# Patient Record
Sex: Male | Born: 1945 | Race: White | Hispanic: No | Marital: Married | Smoking: Former smoker
Health system: Southern US, Community
[De-identification: ages and names within clinical notes are randomized; demographics above are authoritative.]

## PROBLEM LIST (undated history)

## (undated) DIAGNOSIS — R011 Cardiac murmur, unspecified: Secondary | ICD-10-CM

## (undated) DIAGNOSIS — I10 Essential (primary) hypertension: Secondary | ICD-10-CM

## (undated) DIAGNOSIS — E119 Type 2 diabetes mellitus without complications: Secondary | ICD-10-CM

## (undated) DIAGNOSIS — E785 Hyperlipidemia, unspecified: Secondary | ICD-10-CM

## (undated) HISTORY — PX: COLONOSCOPY: SHX174

## (undated) HISTORY — DX: Type 2 diabetes mellitus without complications: E11.9

## (undated) HISTORY — DX: Hyperlipidemia, unspecified: E78.5

---

## 2008-05-28 ENCOUNTER — Ambulatory Visit (HOSPITAL_COMMUNITY): Admission: RE | Admit: 2008-05-28 | Discharge: 2008-05-28 | Payer: Self-pay | Admitting: Orthopedic Surgery

## 2010-09-23 ENCOUNTER — Other Ambulatory Visit (HOSPITAL_COMMUNITY): Payer: Self-pay | Admitting: Family Medicine

## 2010-09-23 ENCOUNTER — Ambulatory Visit (HOSPITAL_COMMUNITY)
Admission: RE | Admit: 2010-09-23 | Discharge: 2010-09-23 | Disposition: A | Discharge status: Home / Self Care | Payer: Medicare Other | Source: Ambulatory Visit | Attending: Family Medicine | Admitting: Family Medicine

## 2010-09-23 DIAGNOSIS — Z139 Encounter for screening, unspecified: Secondary | ICD-10-CM

## 2010-09-23 DIAGNOSIS — I700 Atherosclerosis of aorta: Secondary | ICD-10-CM | POA: Insufficient documentation

## 2010-09-23 DIAGNOSIS — Z1389 Encounter for screening for other disorder: Secondary | ICD-10-CM | POA: Insufficient documentation

## 2012-10-18 ENCOUNTER — Encounter: Payer: Self-pay | Admitting: *Deleted

## 2012-10-19 ENCOUNTER — Ambulatory Visit (INDEPENDENT_AMBULATORY_CARE_PROVIDER_SITE_OTHER): Payer: Medicare Other | Admitting: Family Medicine

## 2012-10-19 ENCOUNTER — Encounter: Payer: Self-pay | Admitting: Family Medicine

## 2012-10-19 VITALS — BP 122/77 | Temp 97.3°F | Wt 206.8 lb

## 2012-10-19 DIAGNOSIS — R300 Dysuria: Secondary | ICD-10-CM

## 2012-10-19 LAB — POCT URINALYSIS DIPSTICK: pH, UA: 5

## 2012-10-19 NOTE — Progress Notes (Signed)
  Subjective:    Patient ID: Jose Shah, male    DOB: 29-Sep-1945, 67 y.o.   MRN: [id number]  Urinary Tract Infection  This is a new problem. The current episode started 1 to 4 weeks ago. The problem occurs intermittently. The problem has been unchanged. The quality of the pain is described as burning. There has been no fever. Associated symptoms include flank pain. Pertinent negatives include no hematuria. He has tried nothing for the symptoms.   He denies abdominal pain denies vomiting diarrhea. Denies fever or chills.   Review of Systems  Genitourinary: Positive for flank pain. Negative for hematuria.       Objective:   Physical Exam Lungs are clear heart is regular pulse normal abdomen soft no guarding or rebound prostate exam is slightly enlarged but not tender. There is some softness to the prostate but he denies pain with it. Penile exam shows urine femur around the head of the penis and around the glans of the penis a probably associated with tinea       Assessment & Plan:  #1 dysuria related to your edema around the foreskin-Nizoral cream as directed #2 I doubt prostatitis his prostate slightly enlarged but not really tender but I told him if his symptoms don't get better start Cipro 500 mg twice a day for 2 weeks he will start this Friday if ongoing trouble.

## 2012-10-29 IMAGING — US US AORTA SCREENING (MEDICARE)
1 series · 14 of 17 positions shown · non-contrast
Comparison: None.

CLINICAL DATA: 64-year-old for initial Medicare screening.

ABDOMINAL AORTA SCREENING ULTRASOUND 09/23/2010:
TECHNIQUE: Ultrasound examination of the abdominal aorta was
performed as a screening evaluation for abdominal aortic aneurysm.

[Series 1: us aorta screening (medicare) · 0.28mm/px · 14 of 17 slices shown]
[im 1/17]
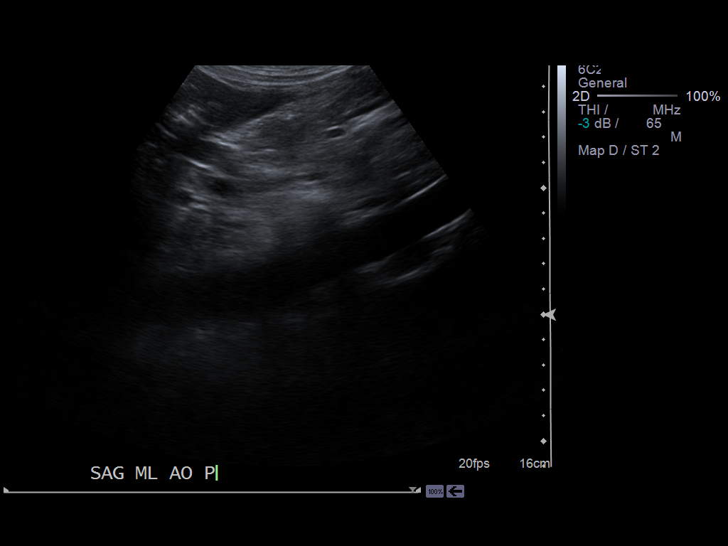
[im 2/17]
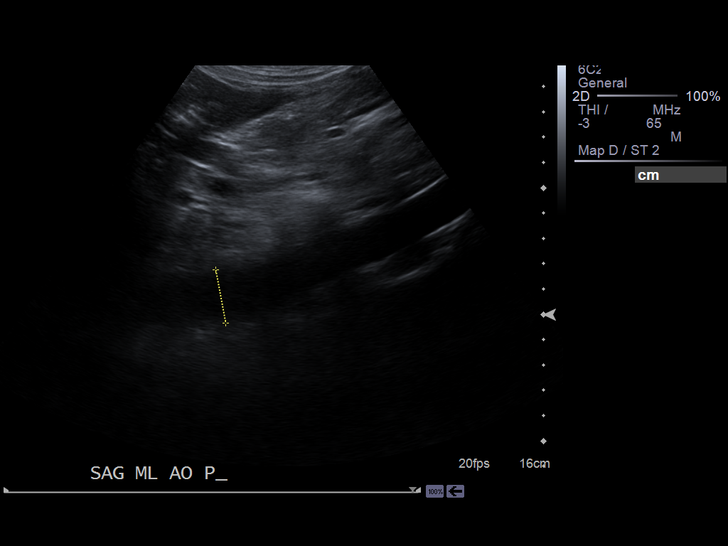
[im 4/17]
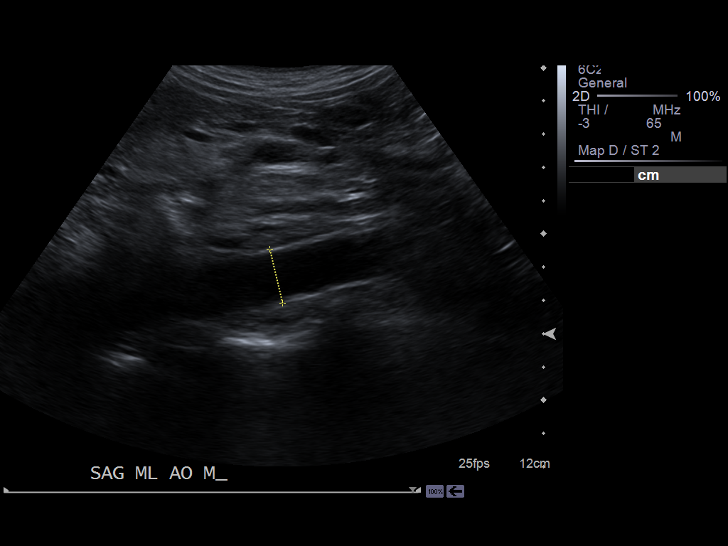
[im 5/17]
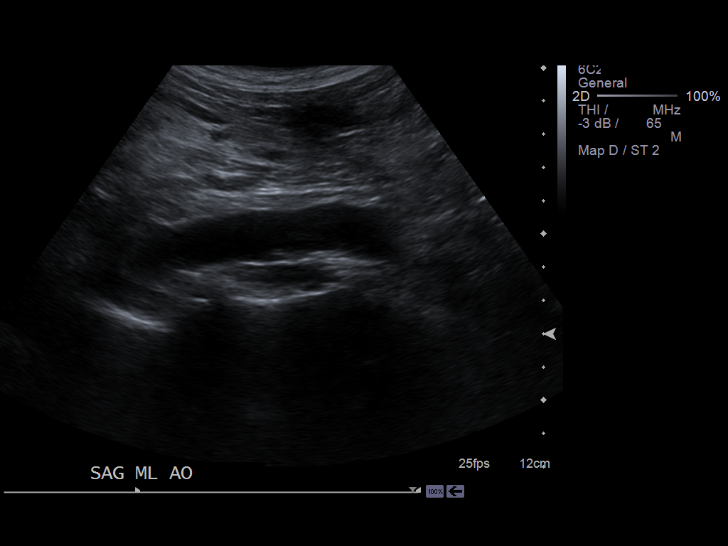
[im 6/17]
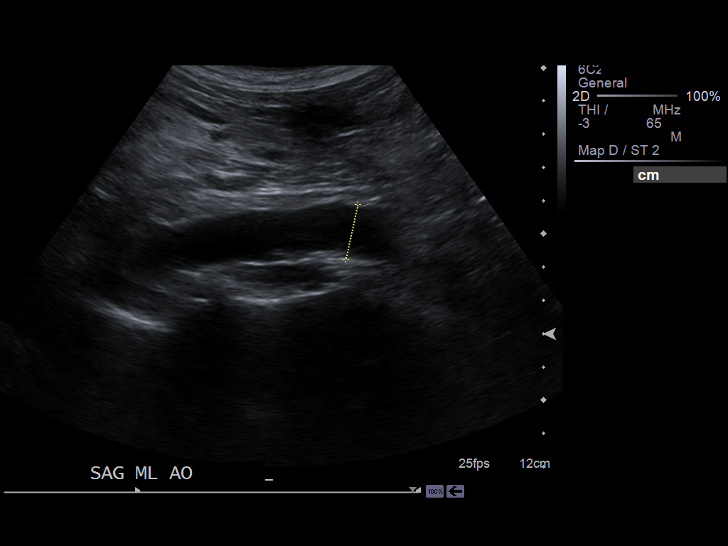
[im 7/17]
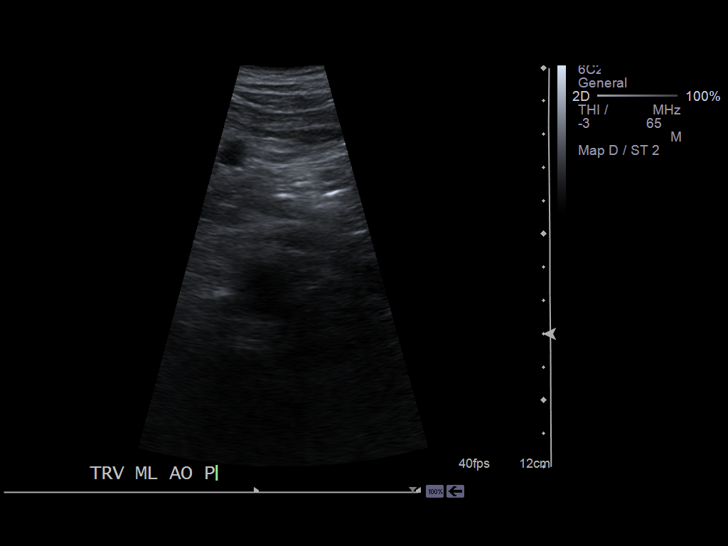
[im 8/17]
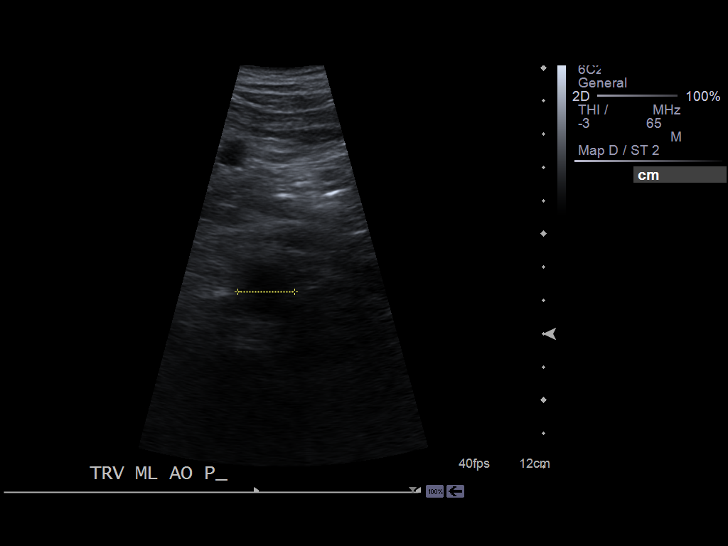
[im 10/17]
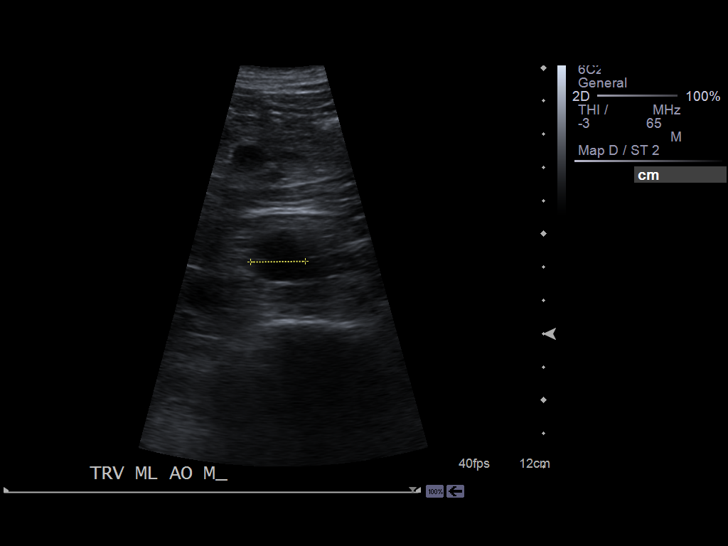
[im 11/17]
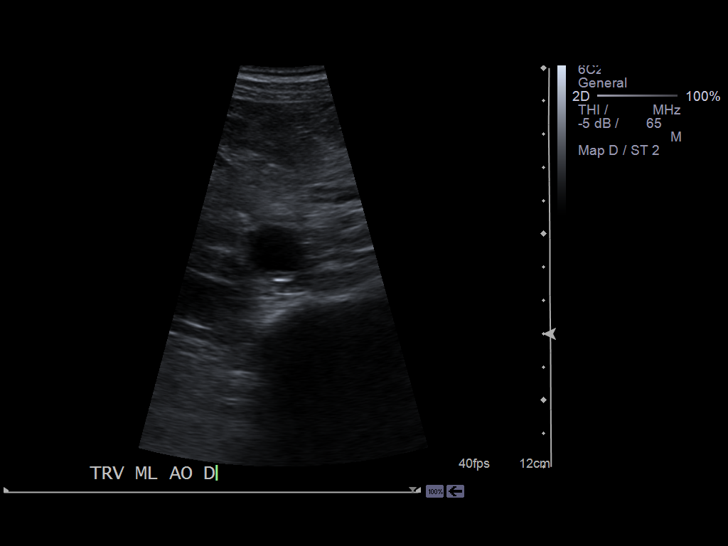
[im 12/17]
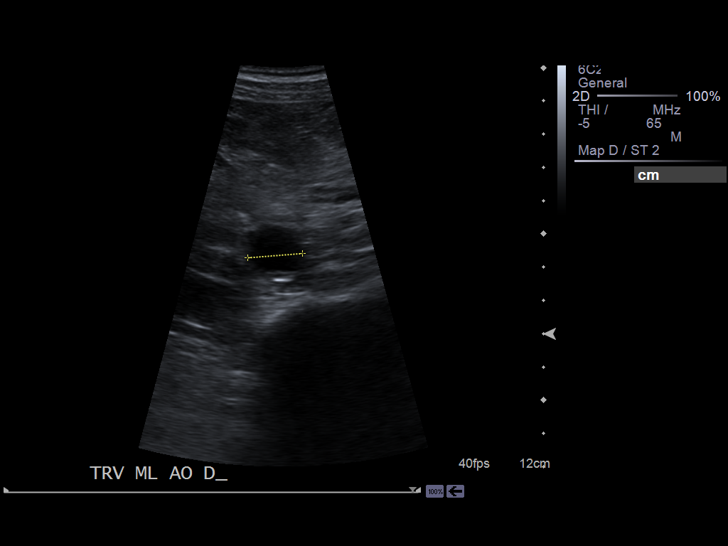
[im 13/17]
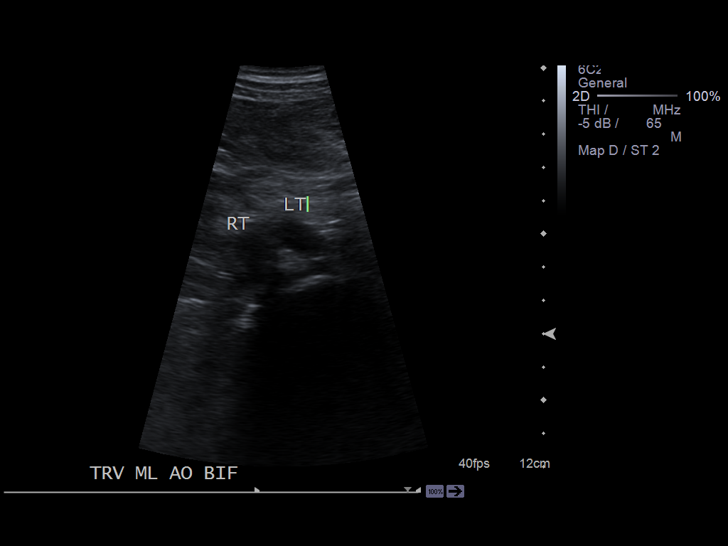
[im 14/17]
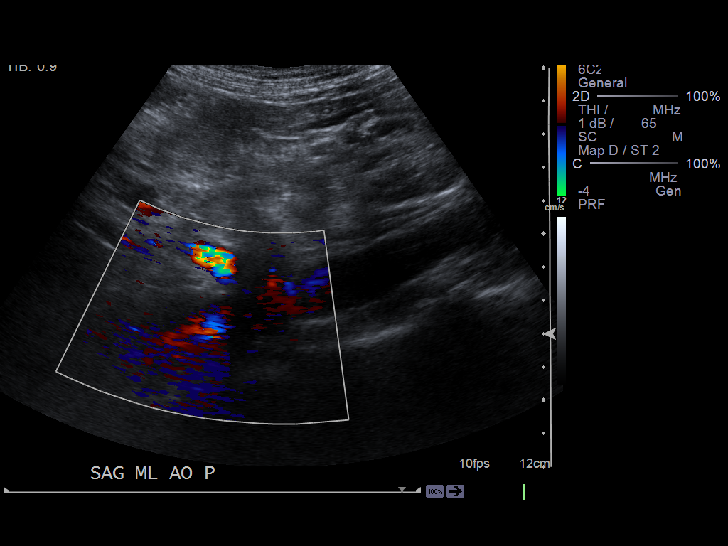
[im 16/17]
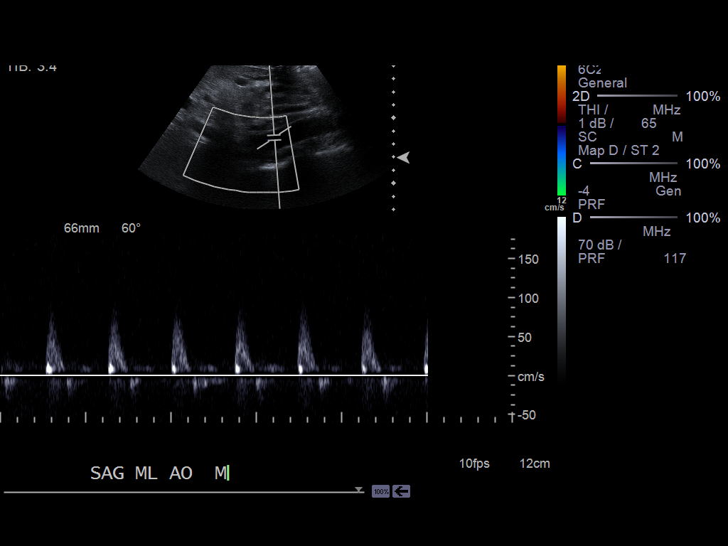
[im 17/17]
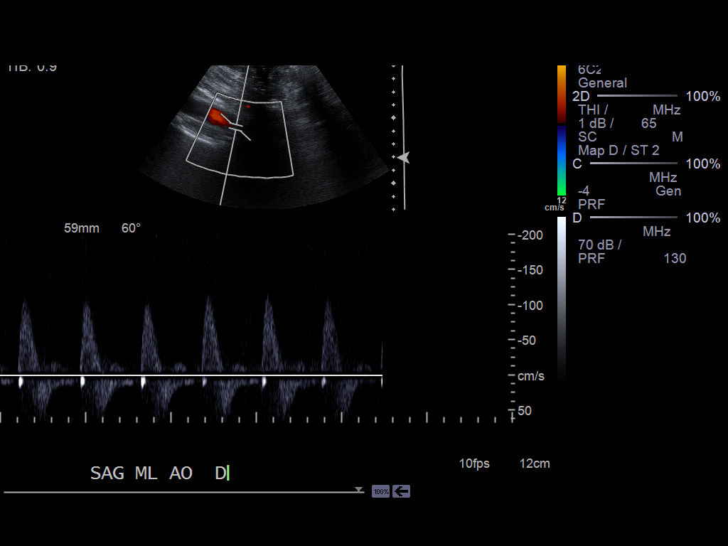

[14 of 17 positions shown; findings below may reference images not displayed]

Abdominal Aorta: No aneurysm identified.  Mild atherosclerosis.

      Maximum AP diameter:  2.1 cm.
      Maximum TRV diameter:  1.7 cm.
IMPRESSION: No evidence of abdominal aortic aneurysm.

## 2012-11-01 ENCOUNTER — Encounter: Payer: Self-pay | Admitting: Family Medicine

## 2012-11-01 ENCOUNTER — Ambulatory Visit (INDEPENDENT_AMBULATORY_CARE_PROVIDER_SITE_OTHER): Payer: Medicare Other | Admitting: Family Medicine

## 2012-11-01 ENCOUNTER — Other Ambulatory Visit: Payer: Self-pay | Admitting: *Deleted

## 2012-11-01 VITALS — BP 128/84 | HR 70 | Wt 209.0 lb

## 2012-11-01 DIAGNOSIS — Z125 Encounter for screening for malignant neoplasm of prostate: Secondary | ICD-10-CM

## 2012-11-01 DIAGNOSIS — E119 Type 2 diabetes mellitus without complications: Secondary | ICD-10-CM | POA: Insufficient documentation

## 2012-11-01 DIAGNOSIS — E785 Hyperlipidemia, unspecified: Secondary | ICD-10-CM | POA: Insufficient documentation

## 2012-11-01 DIAGNOSIS — Z79899 Other long term (current) drug therapy: Secondary | ICD-10-CM

## 2012-11-01 DIAGNOSIS — I10 Essential (primary) hypertension: Secondary | ICD-10-CM

## 2012-11-01 LAB — BASIC METABOLIC PANEL
BUN: 15 mg/dL (ref 6–23)
Calcium: 9.4 mg/dL (ref 8.4–10.5)
Creat: 1.21 mg/dL (ref 0.50–1.35)

## 2012-11-01 LAB — POCT GLYCOSYLATED HEMOGLOBIN (HGB A1C): Hemoglobin A1C: 6

## 2012-11-01 LAB — LIPID PANEL
HDL: 38 mg/dL — ABNORMAL LOW (ref >39)
LDL Cholesterol: 69 mg/dL (ref 0–99)
Triglycerides: 86 mg/dL (ref <150)

## 2012-11-01 LAB — HEPATIC FUNCTION PANEL
ALT: 17 U/L (ref 0–53)
AST: 14 U/L (ref 0–37)
Alkaline Phosphatase: 55 U/L (ref 39–117)
Indirect Bilirubin: 0.4 mg/dL (ref 0.0–0.9)
Total Protein: 6.3 g/dL (ref 6.0–8.3)

## 2012-11-01 MED ORDER — GLYBURIDE 5 MG PO TABS: 5.0000 mg | ORAL_TABLET | Freq: Two times a day (BID) | ORAL | Status: DC

## 2012-11-01 MED ORDER — ENALAPRIL MALEATE 10 MG PO TABS: 10.0000 mg | ORAL_TABLET | Freq: Every day | ORAL | Status: DC

## 2012-11-01 MED ORDER — METFORMIN HCL 1000 MG PO TABS: 1000.0000 mg | ORAL_TABLET | Freq: Two times a day (BID) | ORAL | Status: DC

## 2012-11-01 MED ORDER — SIMVASTATIN 20 MG PO TABS: 20.0000 mg | ORAL_TABLET | Freq: Every evening | ORAL | Status: DC

## 2012-11-01 NOTE — Progress Notes (Signed)
Subjective:    Patient ID: Jose Shah, male    DOB: [birth year], 67 y.o.   MRN: [id number]  Diabetes He has type 2 diabetes mellitus. His disease course has been stable. Hypoglycemia symptoms include nervousness/anxiousness. (Seldom occurs) Pertinent negatives for diabetes include no blurred vision and no chest pain. There are no hypoglycemic complications. Symptoms are stable. There are no diabetic complications. There are no known risk factors for coronary artery disease. Current diabetic treatment includes oral agent (dual therapy). He is compliant with treatment most of the time. He is following a high fat/cholesterol and low fat/cholesterol diet. Meal planning includes avoidance of concentrated sweets. He participates in exercise three times a week. There is no change in his home blood glucose trend. His breakfast blood glucose is taken between 8-9 am. His breakfast blood glucose range is generally 110-130 mg/dl. An ACE inhibitor/angiotensin II receptor blocker is being taken. Eye exam is current.   Staying with b p meds. Numbers generally good. Salt is a weakness. Walking regularly.  Sticking ewith chol meds. No obvious side effects. Reports fair compliance of diet.   Review of Systems  Eyes: Negative for blurred vision.  Cardiovascular: Negative for chest pain.  Psychiatric/Behavioral: The patient is nervous/anxious.        Results for orders placed in visit on 11/01/12  LIPID PANEL      Result Value Range   Cholesterol 124  0 - 200 mg/dL   Triglycerides 86  <295 mg/dL   HDL 38 (*) >28 mg/dL   Total CHOL/HDL Ratio 3.3     VLDL 17  0 - 40 mg/dL   LDL Cholesterol 69  0 - 99 mg/dL  PSA, MEDICARE      Result Value Range   PSA 1.50  <=4.00 ng/mL  HEPATIC FUNCTION PANEL      Result Value Range   Total Bilirubin 0.5  0.3 - 1.2 mg/dL   Bilirubin, Direct 0.1  0.0 - 0.3 mg/dL   Indirect Bilirubin 0.4  0.0 - 0.9 mg/dL   Alkaline Phosphatase 55  39 - 117 U/L   AST 14  0 - 37 U/L   ALT 17  0 - 53 U/L   Total Protein 6.3  6.0 - 8.3 g/dL   Albumin 4.0  3.5 - 5.2 g/dL  BASIC METABOLIC PANEL      Result Value Range   Sodium 139  135 - 145 mEq/L   Potassium 4.7  3.5 - 5.3 mEq/L   Chloride 105  96 - 112 mEq/L   CO2 26  19 - 32 mEq/L   Glucose, Bld 138 (*) 70 - 99 mg/dL   BUN 15  6 - 23 mg/dL   Creat 4.13  2.44 - 0.10 mg/dL   Calcium 9.4  8.4 - 27.2 mg/dL  MICROALBUMIN, URINE      Result Value Range   Microalb, Ur 0.51  0.00 - 1.89 mg/dL  POCT GLYCOSYLATED HEMOGLOBIN (HGB A1C)      Result Value Range   Hemoglobin A1C 6.0      Objective:   Physical Exam  Alert no acute distress. HEENT normal. Vitals reviewed blood pressure good. Lungs clear. Heart regular rate and rhythm. Feet without edema. Sensation intact. Pulses good.      Assessment & Plan:  Impression #1 hypertension good control discussed. #2 type 2 diabetes good control discussed. #3 hyperlipidemia status uncertain addendum sent for blood work results came back good. Plan diet exercise discussed in encourage. Medications refilled. Recheck in  6 months. Of note all blood work and micro-proteinuria came back good

## 2012-11-02 ENCOUNTER — Encounter: Payer: Self-pay | Admitting: Family Medicine

## 2012-11-02 DIAGNOSIS — I10 Essential (primary) hypertension: Secondary | ICD-10-CM | POA: Insufficient documentation

## 2012-11-02 LAB — MICROALBUMIN, URINE: Microalb, Ur: 0.51 mg/dL (ref 0.00–1.89)

## 2013-04-19 ENCOUNTER — Encounter: Payer: Self-pay | Admitting: Family Medicine

## 2013-04-19 ENCOUNTER — Ambulatory Visit (INDEPENDENT_AMBULATORY_CARE_PROVIDER_SITE_OTHER): Payer: Medicare Other | Admitting: Family Medicine

## 2013-04-19 VITALS — BP 122/80 | Temp 98.5°F | Ht 71.0 in | Wt 209.4 lb

## 2013-04-19 DIAGNOSIS — R300 Dysuria: Secondary | ICD-10-CM

## 2013-04-19 DIAGNOSIS — Z23 Encounter for immunization: Secondary | ICD-10-CM

## 2013-04-19 DIAGNOSIS — R21 Rash and other nonspecific skin eruption: Secondary | ICD-10-CM

## 2013-04-19 LAB — POCT URINALYSIS DIPSTICK: pH, UA: 5

## 2013-04-19 MED ORDER — KETOCONAZOLE 2 % EX CREA: 1.0000 "application " | TOPICAL_CREAM | Freq: Two times a day (BID) | CUTANEOUS | Status: DC

## 2013-04-19 MED ORDER — FLUCONAZOLE 200 MG PO TABS: ORAL_TABLET | ORAL | Status: DC

## 2013-04-19 NOTE — Progress Notes (Signed)
  Subjective:    Patient ID: Jose Shah, male    DOB: 1945-08-22, 67 y.o.   MRN: [id number]  Urinary Tract Infection  This is a new problem. The current episode started 1 to 4 weeks ago. The problem occurs every urination. The problem has been unchanged. The quality of the pain is described as burning. The pain is moderate. There has been no fever. He has tried nothing for the symptoms. The treatment provided no relief.   On further history patient notes distal discomfort. Dysuria with urination. Rash on the glans of the penis. Treated back in the summer with Cipro and ketoconazole. No increased frequency no nocturia no fever no chills patient does have diabetes control good   Review of Systems    ROS otherwise negative Objective:   Physical Exam  Alert lungs clear. Heart regular in rhythm. H&T normal. Penis uncircumcised positive yeast dermatitis and balanitis noted  Urinalysis normal    Assessment & Plan:  Impression yeast balanitis with recurrent nature we'll press on and add oral Diflucan plan ketoconazole 3 times a day and oral Diflucan hold off on Zocor while taking. WSL

## 2013-04-19 NOTE — Patient Instructions (Addendum)
This is a yeast infection  Use the cream three times per day, and take all the diflucan tablets  Hold off on simvastatin while taking the diflucan  (just seven days)

## 2013-04-26 ENCOUNTER — Other Ambulatory Visit: Payer: Self-pay | Admitting: *Deleted

## 2013-04-26 ENCOUNTER — Telehealth: Payer: Self-pay

## 2013-04-26 MED ORDER — FLUCONAZOLE 200 MG PO TABS: ORAL_TABLET | ORAL | Status: DC

## 2013-04-26 NOTE — Telephone Encounter (Signed)
Is not unusual to have reoccurring yeast infections with a diabetic. Patient may repeat Diflucan for 7 days, hold Zocor while on it, if progressive problems followup with Dr. Brett Canales. Patient is also due in December to check hemoglobin A1c/diabetic visit

## 2013-04-26 NOTE — Telephone Encounter (Signed)
Patient is still having difficulty from yeast infection he was diagnosed with at his last office visit. He has completed the diflucan that was prescribed. Does he need another round sent in to pharmacy? [facility]

## 2013-04-26 NOTE — Telephone Encounter (Signed)
Med sent to [facility]. Pt notified.

## 2013-05-04 ENCOUNTER — Ambulatory Visit (INDEPENDENT_AMBULATORY_CARE_PROVIDER_SITE_OTHER): Payer: Medicare Other | Admitting: Family Medicine

## 2013-05-04 ENCOUNTER — Encounter: Payer: Self-pay | Admitting: Family Medicine

## 2013-05-04 VITALS — BP 130/80 | Ht 71.5 in | Wt 205.8 lb

## 2013-05-04 DIAGNOSIS — E119 Type 2 diabetes mellitus without complications: Secondary | ICD-10-CM

## 2013-05-04 DIAGNOSIS — I10 Essential (primary) hypertension: Secondary | ICD-10-CM

## 2013-05-04 DIAGNOSIS — E785 Hyperlipidemia, unspecified: Secondary | ICD-10-CM

## 2013-05-04 LAB — HEPATIC FUNCTION PANEL
Albumin: 4.2 g/dL (ref 3.5–5.2)
Alkaline Phosphatase: 52 U/L (ref 39–117)
Indirect Bilirubin: 0.5 mg/dL (ref 0.0–0.9)
Total Bilirubin: 0.6 mg/dL (ref 0.3–1.2)

## 2013-05-04 LAB — LIPID PANEL
LDL Cholesterol: 113 mg/dL — ABNORMAL HIGH (ref 0–99)
VLDL: 34 mg/dL (ref 0–40)

## 2013-05-04 NOTE — Progress Notes (Signed)
   Subjective:    Patient ID: Jose Shah, male    DOB: 01-27-1946, 67 y.o.   MRN: [id number]  HPI Patient is here today for diabetic check up. He states his blood sugars have been WNL.glu's morn number 130 ish. No low sugar spells. Compliant with medications. Mostly good with watching sugars in the diet.  Saw eye doc, everything stable  Compliant with blood pressure medicine. Blood pressures good when checked elsewhere. Has a bit of difficulty with salt.  Yeast infection improving.  See prior notes.  Has a spot on right calf he would like checked.  He would like his refills written out; not sent electronically.     Review of Systems No headache no weight loss no chest pain no abdominal pain no shortness breath no nausea no diaphoresis ROS otherwise negative    Objective:   Physical Exam  Alert HEENT normal. Lungs clear. Heart regular in rhythm. Right medial calf small dermatofibroma noted. Distal penis significant improvement see foot exam      Assessment & Plan:  Impression 1 type 2 diabetes control good at 6.7. #2 hypertension good control. #3 hyperlipidemia status uncertain #4 dermatofibroma discussed reassured plan maintain same meds. Diet exercise discussed. Check in 6 months. WSL

## 2013-05-09 ENCOUNTER — Encounter: Payer: Self-pay | Admitting: Family Medicine

## 2013-11-30 ENCOUNTER — Ambulatory Visit (INDEPENDENT_AMBULATORY_CARE_PROVIDER_SITE_OTHER): Payer: Medicare Other | Admitting: Family Medicine

## 2013-11-30 ENCOUNTER — Encounter: Payer: Self-pay | Admitting: Family Medicine

## 2013-11-30 VITALS — BP 124/82 | Ht 71.5 in | Wt 204.2 lb

## 2013-11-30 DIAGNOSIS — Z125 Encounter for screening for malignant neoplasm of prostate: Secondary | ICD-10-CM

## 2013-11-30 DIAGNOSIS — I10 Essential (primary) hypertension: Secondary | ICD-10-CM

## 2013-11-30 DIAGNOSIS — E785 Hyperlipidemia, unspecified: Secondary | ICD-10-CM

## 2013-11-30 DIAGNOSIS — E782 Mixed hyperlipidemia: Secondary | ICD-10-CM

## 2013-11-30 DIAGNOSIS — E119 Type 2 diabetes mellitus without complications: Secondary | ICD-10-CM

## 2013-11-30 DIAGNOSIS — Z79899 Other long term (current) drug therapy: Secondary | ICD-10-CM

## 2013-11-30 DIAGNOSIS — Z23 Encounter for immunization: Secondary | ICD-10-CM

## 2013-11-30 LAB — HEPATIC FUNCTION PANEL
ALT: 15 U/L (ref 0–53)
AST: 13 U/L (ref 0–37)
Albumin: 4.4 g/dL (ref 3.5–5.2)
Alkaline Phosphatase: 55 U/L (ref 39–117)
BILIRUBIN DIRECT: 0.1 mg/dL (ref 0.0–0.3)
BILIRUBIN INDIRECT: 0.6 mg/dL (ref 0.2–1.2)
BILIRUBIN TOTAL: 0.7 mg/dL (ref 0.2–1.2)
Total Protein: 6.5 g/dL (ref 6.0–8.3)

## 2013-11-30 LAB — LIPID PANEL
CHOL/HDL RATIO: 3.3 ratio
Cholesterol: 126 mg/dL (ref 0–200)
HDL: 38 mg/dL — ABNORMAL LOW (ref >39)
LDL Cholesterol: 68 mg/dL (ref 0–99)
Triglycerides: 100 mg/dL (ref <150)
VLDL: 20 mg/dL (ref 0–40)

## 2013-11-30 LAB — BASIC METABOLIC PANEL
BUN: 19 mg/dL (ref 6–23)
CO2: 29 meq/L (ref 19–32)
Calcium: 9.3 mg/dL (ref 8.4–10.5)
Chloride: 103 mEq/L (ref 96–112)
Creat: 1.15 mg/dL (ref 0.50–1.35)
GLUCOSE: 152 mg/dL — AB (ref 70–99)
POTASSIUM: 4.8 meq/L (ref 3.5–5.3)
SODIUM: 140 meq/L (ref 135–145)

## 2013-11-30 LAB — POCT GLYCOSYLATED HEMOGLOBIN (HGB A1C): HEMOGLOBIN A1C: 5.7

## 2013-11-30 MED ORDER — GLIPIZIDE 5 MG PO TABS: ORAL_TABLET | ORAL | Status: DC

## 2013-11-30 MED ORDER — ENALAPRIL MALEATE 10 MG PO TABS: 10.0000 mg | ORAL_TABLET | Freq: Every day | ORAL | Status: DC

## 2013-11-30 MED ORDER — SIMVASTATIN 20 MG PO TABS: 20.0000 mg | ORAL_TABLET | Freq: Every evening | ORAL | Status: DC

## 2013-11-30 MED ORDER — METFORMIN HCL 1000 MG PO TABS: 1000.0000 mg | ORAL_TABLET | Freq: Two times a day (BID) | ORAL | Status: DC

## 2013-11-30 NOTE — Progress Notes (Signed)
   Subjective:    Patient ID: Jose Shah, male    DOB: 05-25-1946, 68 y.o.   MRN: [id number]  Diabetes He presents for his follow-up diabetic visit. He has type 2 diabetes mellitus. His disease course has been stable. There are no hypoglycemic associated symptoms. There are no diabetic associated symptoms. There are no hypoglycemic complications. Symptoms are stable. There are no diabetic complications. There are no known risk factors for coronary artery disease. Current diabetic treatment includes oral agent (dual therapy). He is compliant with treatment all of the time.  Patient states he has no concerns.  Patient wants prescriptions printed out.   Handling chol med well, eating good, weakness for salt. No obv se fr chol med  complint with bp med  Walking four to five times per wk  Eye doc visit soon Morn numb 120 or 130  80 or 90 in eve  Results for orders placed in visit on 11/30/13  POCT GLYCOSYLATED HEMOGLOBIN (HGB A1C)      Result Value Ref Range   Hemoglobin A1C 5.7      Review of Systems See below    Objective:   Physical Exam Alert no acute distress. HEENT normal. Lungs clear. Heart rare in rhythm. Ankles without edema.       Assessment & Plan:  No headache no chest pain no back pain no abdominal pain no change in bowel habits no blood in stool ROS otherwise negative. Impression #1 type 2 diabetes too tight of control discussed. Also need to switch glyburide to glipizide discussed. #2 hypertension good control. #3 hyperlipidemia status uncertain. Plan Prevnar. Diet exercise discussed. Changes as noted above. Appropriate blood work Forensic scientist in 6 months. WSL

## 2013-12-01 LAB — MICROALBUMIN, URINE: Microalb, Ur: 1.27 mg/dL (ref 0.00–1.89)

## 2013-12-01 LAB — PSA, MEDICARE: PSA: 1.33 ng/mL (ref <=4.00)

## 2013-12-07 ENCOUNTER — Encounter: Payer: Self-pay | Admitting: Family Medicine

## 2014-01-24 ENCOUNTER — Ambulatory Visit (INDEPENDENT_AMBULATORY_CARE_PROVIDER_SITE_OTHER): Payer: Medicare Other | Admitting: Family Medicine

## 2014-01-24 ENCOUNTER — Encounter: Payer: Self-pay | Admitting: Family Medicine

## 2014-01-24 VITALS — BP 118/68 | Temp 98.3°F | Ht 72.0 in | Wt 200.0 lb

## 2014-01-24 DIAGNOSIS — J069 Acute upper respiratory infection, unspecified: Secondary | ICD-10-CM

## 2014-01-24 DIAGNOSIS — B9789 Other viral agents as the cause of diseases classified elsewhere: Principal | ICD-10-CM

## 2014-01-24 MED ORDER — AMOXICILLIN 500 MG PO TABS: 500.0000 mg | ORAL_TABLET | Freq: Three times a day (TID) | ORAL | Status: DC

## 2014-01-24 NOTE — Patient Instructions (Signed)
Currently I feel this is a viral illness.  You can take a allergy pill to see if this might help. I would use the generic of Claritin ( called Loratadine ) 10 mg one daily.  If this gets worse over the next 2 days then get the prescription filled.  Call us if any problems.

## 2014-01-24 NOTE — Progress Notes (Signed)
   Subjective:    Patient ID: Jose Shah, male    DOB: 24-Jan-1946, 68 y.o.   MRN: [id number]  Sore Throat  This is a new problem. The current episode started yesterday. Associated symptoms include coughing and a hoarse voice. Associated symptoms comments: Congestion, runny nose.   he denies fever chills sweats nausea or vomiting    Review of Systems  HENT: Positive for hoarse voice.   Respiratory: Positive for cough.        Objective:   Physical Exam Lungs clear heart regular pulse normal throat normal neck supple eardrums normal slightly       Assessment & Plan:  Very nice gentleman with now upper respiratory illness more than likely a viral process should gradually get better warning signs discussed prescription for antibiotic given in case he gets worse  He may try allergy tablet course TAC helps

## 2014-03-07 ENCOUNTER — Ambulatory Visit (INDEPENDENT_AMBULATORY_CARE_PROVIDER_SITE_OTHER): Payer: Medicare Other | Admitting: *Deleted

## 2014-03-07 DIAGNOSIS — Z23 Encounter for immunization: Secondary | ICD-10-CM

## 2014-05-03 ENCOUNTER — Encounter: Payer: Self-pay | Admitting: Family Medicine

## 2014-05-03 ENCOUNTER — Ambulatory Visit (INDEPENDENT_AMBULATORY_CARE_PROVIDER_SITE_OTHER): Payer: Medicare Other | Admitting: Family Medicine

## 2014-05-03 VITALS — BP 130/80 | Ht 72.0 in | Wt 196.0 lb

## 2014-05-03 DIAGNOSIS — I10 Essential (primary) hypertension: Secondary | ICD-10-CM

## 2014-05-03 DIAGNOSIS — E119 Type 2 diabetes mellitus without complications: Secondary | ICD-10-CM

## 2014-05-03 DIAGNOSIS — E118 Type 2 diabetes mellitus with unspecified complications: Secondary | ICD-10-CM

## 2014-05-03 DIAGNOSIS — Z79899 Other long term (current) drug therapy: Secondary | ICD-10-CM

## 2014-05-03 DIAGNOSIS — E785 Hyperlipidemia, unspecified: Secondary | ICD-10-CM

## 2014-05-03 LAB — HEPATIC FUNCTION PANEL
ALBUMIN: 4 g/dL (ref 3.5–5.2)
ALT: 29 U/L (ref 0–53)
AST: 18 U/L (ref 0–37)
Alkaline Phosphatase: 55 U/L (ref 39–117)
Bilirubin, Direct: 0.1 mg/dL (ref 0.0–0.3)
Indirect Bilirubin: 0.4 mg/dL (ref 0.2–1.2)
TOTAL PROTEIN: 6.4 g/dL (ref 6.0–8.3)
Total Bilirubin: 0.5 mg/dL (ref 0.2–1.2)

## 2014-05-03 LAB — LIPID PANEL
Cholesterol: 122 mg/dL (ref 0–200)
HDL: 39 mg/dL — AB (ref >39)
LDL CALC: 64 mg/dL (ref 0–99)
TRIGLYCERIDES: 94 mg/dL (ref <150)
Total CHOL/HDL Ratio: 3.1 Ratio
VLDL: 19 mg/dL (ref 0–40)

## 2014-05-03 MED ORDER — SIMVASTATIN 20 MG PO TABS: 20.0000 mg | ORAL_TABLET | Freq: Every evening | ORAL | Status: DC

## 2014-05-03 MED ORDER — METFORMIN HCL 1000 MG PO TABS: 1000.0000 mg | ORAL_TABLET | Freq: Two times a day (BID) | ORAL | Status: DC

## 2014-05-03 MED ORDER — GLIPIZIDE 5 MG PO TABS: ORAL_TABLET | ORAL | Status: DC

## 2014-05-03 MED ORDER — ENALAPRIL MALEATE 10 MG PO TABS: 10.0000 mg | ORAL_TABLET | Freq: Every day | ORAL | Status: DC

## 2014-05-03 NOTE — Progress Notes (Signed)
   Subjective:    Patient ID: Jose Shah, male    DOB: [birth year], 68 y.o.   MRN: [id number]  Diabetes He presents for his follow-up diabetic visit. He has type 2 diabetes mellitus. (Fatigue and "whoozy") There are no diabetic associated symptoms. Symptoms are stable. Current diabetic treatment includes oral agent (dual therapy). He is compliant with treatment all of the time. He participates in exercise three times a week. He monitors blood glucose at home 1-2 x per day. His overall blood glucose range is 110-130 mg/dl. He does not see a podiatrist.Eye exam is not current.   Patient compliant with blood pressure medication. No obvious side effect. Trying to watch salt in his diet.  Patient compliant with Zocor. No obvious side effects. Has cut that down. Takes faithfully.  The patient has lost weight with increased efforts at diet and exercise. Fortunately exercising 4-5 times per week.   Review of Systems No headache no chest pain no fatigue no back pain no abdominal pain no change in bowel habits    Objective:   Physical Exam  Alert vitals stable. Blood pressure excellent. HEENT normal. Lungs clear. Heart regular in rhythm. Ankles without edema      Assessment & Plan:  Impression #1 type 2 diabetes excellent control. #2 hyperlipidemia status uncertain. #3 hypertension excellent control plan diet discussed. Exercise discussed. Maintain same medications pending blood work. Recheck in 6 months. WSL

## 2014-05-04 ENCOUNTER — Encounter: Payer: Self-pay | Admitting: Family Medicine

## 2014-09-03 LAB — HM DIABETES EYE EXAM

## 2014-10-05 ENCOUNTER — Encounter: Payer: Self-pay | Admitting: *Deleted

## 2014-11-09 ENCOUNTER — Ambulatory Visit (INDEPENDENT_AMBULATORY_CARE_PROVIDER_SITE_OTHER): Payer: Medicare Other | Admitting: Family Medicine

## 2014-11-09 ENCOUNTER — Encounter: Payer: Self-pay | Admitting: Family Medicine

## 2014-11-09 VITALS — BP 128/82 | Ht 72.0 in | Wt 196.4 lb

## 2014-11-09 DIAGNOSIS — Z79899 Other long term (current) drug therapy: Secondary | ICD-10-CM | POA: Diagnosis not present

## 2014-11-09 DIAGNOSIS — Z125 Encounter for screening for malignant neoplasm of prostate: Secondary | ICD-10-CM

## 2014-11-09 DIAGNOSIS — E785 Hyperlipidemia, unspecified: Secondary | ICD-10-CM

## 2014-11-09 DIAGNOSIS — E119 Type 2 diabetes mellitus without complications: Secondary | ICD-10-CM | POA: Diagnosis not present

## 2014-11-09 LAB — POCT GLYCOSYLATED HEMOGLOBIN (HGB A1C): Hemoglobin A1C: 5.7

## 2014-11-09 MED ORDER — SIMVASTATIN 20 MG PO TABS: 20.0000 mg | ORAL_TABLET | Freq: Every evening | ORAL | Status: DC

## 2014-11-09 MED ORDER — METFORMIN HCL 1000 MG PO TABS: 1000.0000 mg | ORAL_TABLET | Freq: Two times a day (BID) | ORAL | Status: DC

## 2014-11-09 MED ORDER — ENALAPRIL MALEATE 10 MG PO TABS: 10.0000 mg | ORAL_TABLET | Freq: Every day | ORAL | Status: DC

## 2014-11-09 MED ORDER — GLIPIZIDE 5 MG PO TABS: ORAL_TABLET | ORAL | Status: DC

## 2014-11-09 NOTE — Progress Notes (Signed)
   Subjective:    Patient ID: Jose Shah, male    DOB: [birth year], 69 y.o.   MRN: [id number]  Diabetes He presents for his follow-up diabetic visit. He has type 2 diabetes mellitus. His disease course has been stable. Symptoms are stable. Current diabetic treatment includes oral agent (monotherapy). He is compliant with treatment all of the time. His weight is stable. He participates in exercise three times a week. There is no change in his home blood glucose trend. He does not see a podiatrist.Eye exam is current.   Results for orders placed or performed in visit on 11/09/14  POCT HgB A1C  Result Value Ref Range   Hemoglobin A1C 5.7     Patient states no other concerns this visit.  Mostly eating better but not as much,  Watching diet better   BP generally good when cked elexseher. Compliant with blood pressure medicine. No obvious side effects. Has cut down salt intake.  Compliant with cholesterol medicine. No obvious side effects. Watching fat and diet. Does not miss a dose.  Eye exam in April no diab retinopathy   posditive glaucoma Review of Systems No headache no chest pain and back pain no abdominal pain no change in bowel habits no blood in stool    Objective:   Physical Exam Alert vitals stable blood pressure good on repeat HEENT normal. Lungs clear. Heart regular in rhythm. Ankles without edema.       Assessment & Plan:  Impression 1 type 2 diabetes excellent control discussed #2 hypertension very good control discussed #3 hyperlipidemia status uncertain plan appropriate blood work. Medications refilled. Diet exercise discussed recheck in 6 months. Wellness plus problem visit WSL

## 2014-11-10 LAB — LIPID PANEL
Chol/HDL Ratio: 3.2 ratio units (ref 0.0–5.0)
Cholesterol, Total: 127 mg/dL (ref 100–199)
HDL: 40 mg/dL (ref >39)
LDL Calculated: 69 mg/dL (ref 0–99)
Triglycerides: 90 mg/dL (ref 0–149)
VLDL CHOLESTEROL CAL: 18 mg/dL (ref 5–40)

## 2014-11-10 LAB — BASIC METABOLIC PANEL
BUN/Creatinine Ratio: 15 (ref 10–22)
BUN: 15 mg/dL (ref 8–27)
CO2: 24 mmol/L (ref 18–29)
Calcium: 9.2 mg/dL (ref 8.6–10.2)
Chloride: 102 mmol/L (ref 97–108)
Creatinine, Ser: 0.99 mg/dL (ref 0.76–1.27)
GFR, EST AFRICAN AMERICAN: 89 mL/min/{1.73_m2} (ref >59)
GFR, EST NON AFRICAN AMERICAN: 77 mL/min/{1.73_m2} (ref >59)
GLUCOSE: 151 mg/dL — AB (ref 65–99)
POTASSIUM: 4.5 mmol/L (ref 3.5–5.2)
Sodium: 141 mmol/L (ref 134–144)

## 2014-11-10 LAB — HEPATIC FUNCTION PANEL
ALK PHOS: 60 IU/L (ref 39–117)
ALT: 12 IU/L (ref 0–44)
AST: 14 IU/L (ref 0–40)
Albumin: 4.5 g/dL (ref 3.6–4.8)
BILIRUBIN TOTAL: 0.4 mg/dL (ref 0.0–1.2)
BILIRUBIN, DIRECT: 0.16 mg/dL (ref 0.00–0.40)
Total Protein: 6.5 g/dL (ref 6.0–8.5)

## 2014-11-10 LAB — MICROALBUMIN, URINE: MICROALBUM., U, RANDOM: 9.2 ug/mL

## 2014-11-10 LAB — PSA: Prostate Specific Ag, Serum: 1.3 ng/mL (ref 0.0–4.0)

## 2014-11-11 ENCOUNTER — Encounter: Payer: Self-pay | Admitting: Family Medicine

## 2015-01-07 ENCOUNTER — Telehealth: Payer: Self-pay | Admitting: Family Medicine

## 2015-01-07 NOTE — Telephone Encounter (Signed)
Patient needs Rx of his Easy Max diabetic strips   [facility]

## 2015-01-07 NOTE — Telephone Encounter (Signed)
Notified patient script was faxed over to [facility].

## 2015-05-14 ENCOUNTER — Encounter: Payer: Self-pay | Admitting: Family Medicine

## 2015-05-14 ENCOUNTER — Ambulatory Visit (INDEPENDENT_AMBULATORY_CARE_PROVIDER_SITE_OTHER): Payer: Medicare Other | Admitting: Family Medicine

## 2015-05-14 VITALS — BP 114/70 | Wt 193.0 lb

## 2015-05-14 DIAGNOSIS — E119 Type 2 diabetes mellitus without complications: Secondary | ICD-10-CM

## 2015-05-14 DIAGNOSIS — Z23 Encounter for immunization: Secondary | ICD-10-CM

## 2015-05-14 DIAGNOSIS — E785 Hyperlipidemia, unspecified: Secondary | ICD-10-CM | POA: Diagnosis not present

## 2015-05-14 DIAGNOSIS — Z79899 Other long term (current) drug therapy: Secondary | ICD-10-CM

## 2015-05-14 LAB — POCT GLYCOSYLATED HEMOGLOBIN (HGB A1C): Hemoglobin A1C: 6.5

## 2015-05-14 MED ORDER — ENALAPRIL MALEATE 10 MG PO TABS: 10.0000 mg | ORAL_TABLET | Freq: Every day | ORAL | Status: DC

## 2015-05-14 MED ORDER — GLIPIZIDE 5 MG PO TABS: ORAL_TABLET | ORAL | Status: DC

## 2015-05-14 MED ORDER — SIMVASTATIN 20 MG PO TABS: 20.0000 mg | ORAL_TABLET | Freq: Every evening | ORAL | Status: DC

## 2015-05-14 MED ORDER — METFORMIN HCL 1000 MG PO TABS: 1000.0000 mg | ORAL_TABLET | Freq: Two times a day (BID) | ORAL | Status: DC

## 2015-05-14 NOTE — Progress Notes (Signed)
   Subjective:    Patient ID: Jose Shah, male    DOB: 29-Jun-1945, 69 y.o.   MRN: [id number] Patient arrives office for several concerns   Diabetes He presents for his follow-up diabetic visit. He has type 2 diabetes mellitus. Current diabetic treatment includes oral agent (dual therapy). He is compliant with treatment all of the time. He participates in exercise three times a week. His breakfast blood glucose range is generally 90-110 mg/dl. He does not see a podiatrist.Eye exam is current.  morn numbers generally a little hi in the morn but better Flu vaccine. Just given  Results for orders placed or performed in visit on 05/14/15  POCT glycosylated hemoglobin (Hb A1C)  Result Value Ref Range   Hemoglobin A1C 6.5    Less appetite than in the past  Exercise slacked lately  Compliant with blood pressure medication. Meds reviewed today. No obvious side effect. Watching salt intake.   Compliant with lipid medicines. Old blood work reviewed with patient meds reviewed today. Reports fair compliance with fats in diet  Review of Systems No headache no chest pain no back pain no abdominal pain no change in bowel habits ROS otherwise negative    Objective:   Physical Exam  Alert vital stable. HEENT normal. Lungs clear heart rare rhythm ankles without edema pulses intact sensation good      Assessment & Plan:  Impression #1 type 2 diabetes control good discussed maintain same meds #2 hypertension controlled good discuss maintain same meds #3 hyperlipidemia control so far good maintain meds for now weight blood work may change based on results plan flu shot today. Appropriate blood work diet exercise discussed meds reviewed recheck in 6 months WSL of note patient advised he was due for colonoscopy last year. Encouraged to call and schedule WSL

## 2015-05-15 LAB — HEPATIC FUNCTION PANEL
ALBUMIN: 4.6 g/dL (ref 3.6–4.8)
ALK PHOS: 62 IU/L (ref 39–117)
ALT: 43 IU/L (ref 0–44)
AST: 21 IU/L (ref 0–40)
BILIRUBIN TOTAL: 0.6 mg/dL (ref 0.0–1.2)
Bilirubin, Direct: 0.2 mg/dL (ref 0.00–0.40)
TOTAL PROTEIN: 6.8 g/dL (ref 6.0–8.5)

## 2015-05-15 LAB — LIPID PANEL
Chol/HDL Ratio: 3.3 ratio (ref 0.0–5.0)
Cholesterol, Total: 133 mg/dL (ref 100–199)
HDL: 40 mg/dL
LDL Calculated: 72 mg/dL (ref 0–99)
Triglycerides: 104 mg/dL (ref 0–149)
VLDL Cholesterol Cal: 21 mg/dL (ref 5–40)

## 2015-05-23 ENCOUNTER — Encounter: Payer: Self-pay | Admitting: Family Medicine

## 2015-08-05 ENCOUNTER — Encounter: Payer: Self-pay | Admitting: Family Medicine

## 2015-08-05 ENCOUNTER — Ambulatory Visit (INDEPENDENT_AMBULATORY_CARE_PROVIDER_SITE_OTHER): Payer: Medicare Other | Admitting: Family Medicine

## 2015-08-05 VITALS — BP 122/82 | Temp 98.0°F | Ht 72.0 in | Wt 192.0 lb

## 2015-08-05 DIAGNOSIS — J111 Influenza due to unidentified influenza virus with other respiratory manifestations: Secondary | ICD-10-CM

## 2015-08-05 MED ORDER — HYDROCODONE-HOMATROPINE 5-1.5 MG/5ML PO SYRP: ORAL_SOLUTION | ORAL | Status: DC

## 2015-08-05 MED ORDER — OSELTAMIVIR PHOSPHATE 75 MG PO CAPS: ORAL_CAPSULE | ORAL | Status: DC

## 2015-08-05 NOTE — Progress Notes (Signed)
   Subjective:    Patient ID: Jose Shah, male    DOB: August 12, 1945, 70 y.o.   MRN: [id number]  Cough This is a new problem. Episode onset: 2 days. Associated symptoms include a fever, headaches, nasal congestion and a sore throat. Treatments tried: otc cold meds.    Bad coughin and cannot stopp  Energy level down  Appetite last couple d not as much  Review of Systems  Constitutional: Positive for fever.  HENT: Positive for sore throat.   Respiratory: Positive for cough.   Neurological: Positive for headaches.       Objective:   Physical Exam  Alert moderate malaise. Hydration fair. Intermittent cough during exam. Vital stable. HEENT moderate nasal congestion frontal neck supple lungs no wheezes or crackles heart regular in rhythm      Assessment & Plan:  Impression influenza with patient's age and diabetes status will cover with antiviral biotics. Did get a flu shot plan Tamiflu twice a day. Symptom care discussed warning signs discussed WSL

## 2015-11-13 ENCOUNTER — Encounter: Payer: Self-pay | Admitting: Family Medicine

## 2015-11-13 ENCOUNTER — Ambulatory Visit (INDEPENDENT_AMBULATORY_CARE_PROVIDER_SITE_OTHER): Payer: Medicare Other | Admitting: Family Medicine

## 2015-11-13 VITALS — BP 118/84 | Ht 72.0 in | Wt 191.0 lb

## 2015-11-13 DIAGNOSIS — Z125 Encounter for screening for malignant neoplasm of prostate: Secondary | ICD-10-CM

## 2015-11-13 DIAGNOSIS — I10 Essential (primary) hypertension: Secondary | ICD-10-CM

## 2015-11-13 DIAGNOSIS — E119 Type 2 diabetes mellitus without complications: Secondary | ICD-10-CM | POA: Diagnosis not present

## 2015-11-13 DIAGNOSIS — Z79899 Other long term (current) drug therapy: Secondary | ICD-10-CM

## 2015-11-13 DIAGNOSIS — E785 Hyperlipidemia, unspecified: Secondary | ICD-10-CM | POA: Diagnosis not present

## 2015-11-13 LAB — POCT GLYCOSYLATED HEMOGLOBIN (HGB A1C): HEMOGLOBIN A1C: 5.3

## 2015-11-13 MED ORDER — SIMVASTATIN 20 MG PO TABS: 20.0000 mg | ORAL_TABLET | Freq: Every evening | ORAL | Status: DC

## 2015-11-13 MED ORDER — ENALAPRIL MALEATE 10 MG PO TABS: 10.0000 mg | ORAL_TABLET | Freq: Every day | ORAL | Status: DC

## 2015-11-13 MED ORDER — METFORMIN HCL 1000 MG PO TABS: 1000.0000 mg | ORAL_TABLET | Freq: Two times a day (BID) | ORAL | Status: DC

## 2015-11-13 MED ORDER — GLIPIZIDE 5 MG PO TABS: ORAL_TABLET | ORAL | Status: DC

## 2015-11-13 NOTE — Progress Notes (Signed)
   Subjective:    Patient ID: Jose Shah, male    DOB: May 24, 1946, 70 y.o.   MRN: [id number] Patient arrives office with several distinct concerns Diabetes He presents for his follow-up diabetic visit. He has type 2 diabetes mellitus. There are no hypoglycemic associated symptoms. There are no diabetic associated symptoms. There are no hypoglycemic complications. There are no diabetic complications. There are no known risk factors for coronary artery disease. Current diabetic treatment includes oral agent (dual therapy). He is compliant with treatment all of the time.   Patient states that he has had a diabetic eye exam this year.   Claims complete compliance with blood pressure medication. Does not miss a dose no obvious side effects blood pressure good when checked elsewhere  Compliant with cholesterol medicine mostly watch his diet. No obvious side effects. Curious about results  No true low sugar spells though sometimes gets slightly shaky   Patient has no concerns at this time.   Results for orders placed or performed in visit on 11/13/15  POCT glycosylated hemoglobin (Hb A1C)  Result Value Ref Range   Hemoglobin A1C 5.3       Review of Systems No headache, no major weight loss or weight gain, no chest pain no back pain abdominal pain no change in bowel habits complete ROS otherwise negative     Objective:   Physical Exam   Alert vital stable blood pressure excellent on repeat HEENT normal lungs clear. Heart rare rhythm. Ankles no significant edema     Assessment & Plan:  Impression 1 type 2 diabetes controlled to tight discuss will back off on meds #2 hypertension good control meds reviewed to maintain same #3 hyperlipidemia prior blood work results reviewed to maintain same pending blood work discussed #4 patient given colonoscopy she promised he would call and set this up follow-up as scheduled

## 2015-11-14 LAB — BASIC METABOLIC PANEL
BUN/Creatinine Ratio: 16 (ref 10–24)
BUN: 15 mg/dL (ref 8–27)
CO2: 22 mmol/L (ref 18–29)
CREATININE: 0.95 mg/dL (ref 0.76–1.27)
Calcium: 9.5 mg/dL (ref 8.6–10.2)
Chloride: 100 mmol/L (ref 96–106)
GFR calc Af Amer: 93 mL/min/{1.73_m2} (ref >59)
GFR, EST NON AFRICAN AMERICAN: 81 mL/min/{1.73_m2} (ref >59)
GLUCOSE: 166 mg/dL — AB (ref 65–99)
Potassium: 4.8 mmol/L (ref 3.5–5.2)
SODIUM: 141 mmol/L (ref 134–144)

## 2015-11-14 LAB — LIPID PANEL
CHOL/HDL RATIO: 3.4 ratio (ref 0.0–5.0)
Cholesterol, Total: 133 mg/dL (ref 100–199)
HDL: 39 mg/dL — AB (ref >39)
LDL Calculated: 74 mg/dL (ref 0–99)
Triglycerides: 98 mg/dL (ref 0–149)
VLDL CHOLESTEROL CAL: 20 mg/dL (ref 5–40)

## 2015-11-14 LAB — HEPATIC FUNCTION PANEL
ALBUMIN: 4.3 g/dL (ref 3.5–4.8)
ALK PHOS: 56 IU/L (ref 39–117)
ALT: 11 IU/L (ref 0–44)
AST: 11 IU/L (ref 0–40)
BILIRUBIN, DIRECT: 0.15 mg/dL (ref 0.00–0.40)
Bilirubin Total: 0.5 mg/dL (ref 0.0–1.2)
TOTAL PROTEIN: 6.4 g/dL (ref 6.0–8.5)

## 2015-11-14 LAB — PSA: PROSTATE SPECIFIC AG, SERUM: 1.3 ng/mL (ref 0.0–4.0)

## 2015-11-15 ENCOUNTER — Encounter: Payer: Self-pay | Admitting: Family Medicine

## 2015-12-31 ENCOUNTER — Telehealth (INDEPENDENT_AMBULATORY_CARE_PROVIDER_SITE_OTHER): Payer: Self-pay | Admitting: *Deleted

## 2015-12-31 NOTE — Telephone Encounter (Signed)
Wife calls in.  Dr. Mickie Hillier asked that they call and get set up for screening colonoscopy.  [phone]

## 2016-01-08 NOTE — Telephone Encounter (Signed)
TCS sch'd 04/22/16, patient aware

## 2016-01-09 ENCOUNTER — Other Ambulatory Visit (INDEPENDENT_AMBULATORY_CARE_PROVIDER_SITE_OTHER): Payer: Self-pay | Admitting: *Deleted

## 2016-01-09 DIAGNOSIS — Z1211 Encounter for screening for malignant neoplasm of colon: Secondary | ICD-10-CM

## 2016-03-06 ENCOUNTER — Telehealth (INDEPENDENT_AMBULATORY_CARE_PROVIDER_SITE_OTHER): Payer: Self-pay | Admitting: *Deleted

## 2016-03-06 ENCOUNTER — Encounter (INDEPENDENT_AMBULATORY_CARE_PROVIDER_SITE_OTHER): Payer: Self-pay | Admitting: *Deleted

## 2016-03-06 MED ORDER — PEG 3350-KCL-NA BICARB-NACL 420 G PO SOLR: 4000.0000 mL | Freq: Once | ORAL | 0 refills | Status: AC

## 2016-03-06 NOTE — Telephone Encounter (Signed)
Patient need trilyte 

## 2016-03-10 ENCOUNTER — Encounter: Payer: Self-pay | Admitting: Family Medicine

## 2016-03-10 ENCOUNTER — Ambulatory Visit (INDEPENDENT_AMBULATORY_CARE_PROVIDER_SITE_OTHER): Payer: Medicare Other | Admitting: Family Medicine

## 2016-03-10 VITALS — BP 138/80 | Temp 97.9°F | Ht 72.0 in | Wt 189.1 lb

## 2016-03-10 DIAGNOSIS — D171 Benign lipomatous neoplasm of skin and subcutaneous tissue of trunk: Secondary | ICD-10-CM | POA: Diagnosis not present

## 2016-03-10 DIAGNOSIS — E119 Type 2 diabetes mellitus without complications: Secondary | ICD-10-CM

## 2016-03-10 NOTE — Progress Notes (Signed)
   Subjective:    Patient ID: Jose Shah, male    DOB: February 09, 1946, 70 y.o.   MRN: [id number]  HPI Patient in today for cyst to right side of abdomen. Onset of symptoms yesterday.  Results for orders placed or performed in visit on 11/13/15  Lipid panel  Result Value Ref Range   Cholesterol, Total 133 100 - 199 mg/dL   Triglycerides 98 0 - 149 mg/dL   HDL 39 (L) >39 mg/dL   VLDL Cholesterol Cal 20 5 - 40 mg/dL   LDL Calculated 74 0 - 99 mg/dL   Chol/HDL Ratio 3.4 0.0 - 5.0 ratio units  Hepatic function panel  Result Value Ref Range   Total Protein 6.4 6.0 - 8.5 g/dL   Albumin 4.3 3.5 - 4.8 g/dL   Bilirubin Total 0.5 0.0 - 1.2 mg/dL   Bilirubin, Direct 0.15 0.00 - 0.40 mg/dL   Alkaline Phosphatase 56 39 - 117 IU/L   AST 11 0 - 40 IU/L   ALT 11 0 - 44 IU/L  Basic metabolic panel  Result Value Ref Range   Glucose 166 (H) 65 - 99 mg/dL   BUN 15 8 - 27 mg/dL   Creatinine, Ser 0.95 0.76 - 1.27 mg/dL   GFR calc non Af Amer 81 >59 mL/min/1.73   GFR calc Af Amer 93 >59 mL/min/1.73   BUN/Creatinine Ratio 16 10 - 24   Sodium 141 134 - 144 mmol/L   Potassium 4.8 3.5 - 5.2 mmol/L   Chloride 100 96 - 106 mmol/L   CO2 22 18 - 29 mmol/L   Calcium 9.5 8.6 - 10.2 mg/dL  PSA  Result Value Ref Range   Prostate Specific Ag, Serum 1.3 0.0 - 4.0 ng/mL  POCT glycosylated hemoglobin (Hb A1C)  Result Value Ref Range   Hemoglobin A1C 5.3    Not painful patient states just discovered it. Not sure how long it's been going on for. Has lost some weight recently with his dietary efforts and feels it may be more prominent now   States no other concerns this visit.  [id number]  Review of Systems No headache, no major weight loss or weight gain, no chest pain no back pain abdominal pain no change in bowel habits complete ROS otherwise negative     Objective:   Physical Exam  Alert vitals stable, NAD. Blood pressure good on repeat. HEENT normal. Lungs clear. Heart regular rate and  rhythm. Right lower lateral abdomen palpable mass 99.9% likelihood lipoma in consistency      Assessment & Plan:  Impression probable lipoma very high likelihood of this. Plan in fitting with latest recommendations will still refer to general surgeon for tissue diagnosis discussed

## 2016-03-11 ENCOUNTER — Ambulatory Visit: Payer: Medicare Other

## 2016-03-30 ENCOUNTER — Telehealth (INDEPENDENT_AMBULATORY_CARE_PROVIDER_SITE_OTHER): Payer: Self-pay | Admitting: *Deleted

## 2016-03-30 NOTE — Telephone Encounter (Signed)
Referring MD/PCP: steve luking   Procedure: tcs  Reason/Indication:  screening  Has patient had this procedure before?  Yes, around 10 yrs ago  If so, when, by whom and where?    Is there a family history of colon cancer?  no  Who?  What age when diagnosed?    Is patient diabetic?   yes      Does patient have prosthetic heart valve or mechanical valve?  no  Do you have a pacemaker?  no  Has patient ever had endocarditis? no  Has patient had joint replacement within last 12 months?  no  Does patient tend to be constipated or take laxatives? no  Does patient have a history of alcohol/drug use?  no  Is patient on Coumadin, Plavix and/or Aspirin? yes  Medications: asa 81 mg daily, metforkin 1000 mg bid, glipizide 5 mg 1/2 tab bid, enalapril 10 mg daily, vit c daily, simvastatin 20 mg daily  Allergies: nkda  Medication Adjustment: asa 2 days, hold metformin & glipizide evening before & morning of  Procedure date & time: 04/29/16 at 1030

## 2016-03-30 NOTE — Telephone Encounter (Signed)
agree

## 2016-04-17 LAB — HM DIABETES EYE EXAM

## 2016-04-29 ENCOUNTER — Ambulatory Visit (HOSPITAL_COMMUNITY)
Admission: RE | Admit: 2016-04-29 | Discharge: 2016-04-29 | Disposition: A | Discharge status: Home / Self Care | Payer: Medicare Other | Source: Ambulatory Visit | Attending: Internal Medicine | Admitting: Internal Medicine

## 2016-04-29 ENCOUNTER — Encounter (HOSPITAL_COMMUNITY)
Admission: RE | Disposition: A | Discharge status: Home / Self Care | Payer: Self-pay | Source: Ambulatory Visit | Attending: Internal Medicine

## 2016-04-29 ENCOUNTER — Encounter (HOSPITAL_COMMUNITY): Payer: Self-pay

## 2016-04-29 DIAGNOSIS — K644 Residual hemorrhoidal skin tags: Secondary | ICD-10-CM | POA: Insufficient documentation

## 2016-04-29 DIAGNOSIS — Z7984 Long term (current) use of oral hypoglycemic drugs: Secondary | ICD-10-CM | POA: Diagnosis not present

## 2016-04-29 DIAGNOSIS — I10 Essential (primary) hypertension: Secondary | ICD-10-CM | POA: Diagnosis not present

## 2016-04-29 DIAGNOSIS — Z87891 Personal history of nicotine dependence: Secondary | ICD-10-CM | POA: Insufficient documentation

## 2016-04-29 DIAGNOSIS — K562 Volvulus: Secondary | ICD-10-CM | POA: Insufficient documentation

## 2016-04-29 DIAGNOSIS — Z7982 Long term (current) use of aspirin: Secondary | ICD-10-CM | POA: Diagnosis not present

## 2016-04-29 DIAGNOSIS — E785 Hyperlipidemia, unspecified: Secondary | ICD-10-CM | POA: Insufficient documentation

## 2016-04-29 DIAGNOSIS — E119 Type 2 diabetes mellitus without complications: Secondary | ICD-10-CM | POA: Insufficient documentation

## 2016-04-29 DIAGNOSIS — Q438 Other specified congenital malformations of intestine: Secondary | ICD-10-CM | POA: Diagnosis not present

## 2016-04-29 DIAGNOSIS — K648 Other hemorrhoids: Secondary | ICD-10-CM | POA: Insufficient documentation

## 2016-04-29 DIAGNOSIS — Z1211 Encounter for screening for malignant neoplasm of colon: Secondary | ICD-10-CM | POA: Diagnosis not present

## 2016-04-29 HISTORY — PX: COLONOSCOPY: SHX5424

## 2016-04-29 HISTORY — DX: Essential (primary) hypertension: I10

## 2016-04-29 HISTORY — DX: Cardiac murmur, unspecified: R01.1

## 2016-04-29 LAB — GLUCOSE, CAPILLARY: GLUCOSE-CAPILLARY: 173 mg/dL — AB (ref 65–99)

## 2016-04-29 SURGERY — COLONOSCOPY
Anesthesia: Moderate Sedation

## 2016-04-29 MED ORDER — MEPERIDINE HCL 50 MG/ML IJ SOLN
INTRAMUSCULAR | Status: DC | PRN
  Administered 2016-04-29 (×2): 25 mg via INTRAVENOUS

## 2016-04-29 MED ORDER — SODIUM CHLORIDE 0.9 % IV SOLN
INTRAVENOUS | Status: DC
  Administered 2016-04-29: 1000 mL via INTRAVENOUS

## 2016-04-29 MED ORDER — MIDAZOLAM HCL 5 MG/5ML IJ SOLN
INTRAMUSCULAR | Status: AC
  Filled 2016-04-29: qty 10

## 2016-04-29 MED ORDER — MIDAZOLAM HCL 5 MG/5ML IJ SOLN
INTRAMUSCULAR | Status: DC | PRN
  Administered 2016-04-29 (×3): 2 mg via INTRAVENOUS

## 2016-04-29 MED ORDER — MEPERIDINE HCL 50 MG/ML IJ SOLN
INTRAMUSCULAR | Status: AC
  Filled 2016-04-29: qty 1

## 2016-04-29 NOTE — Op Note (Signed)
[facility] Patient Name: Jose Shah Procedure Date: 04/29/2016 10:46 AM MRN: [id number] Date of Birth: 01-15-1946 Attending MD: Hildred Laser , MD CSN: [id number] Age: 70 Admit Type: Outpatient Procedure:                Colonoscopy Indications:              Screening for colorectal malignant neoplasm Providers:                Hildred Laser, MD, Lurline Del, RN, Purcell Nails. [place],                            [employer] Referring MD:             Grace Bushy. Wolfgang Phoenix, MD Medicines:                Meperidine 50 mg IV, Midazolam 6 mg IV Complications:            No immediate complications. Estimated Blood Loss:     Estimated blood loss: none. Procedure:                Pre-Anesthesia Assessment:                           - Prior to the procedure, a History and Physical                            was performed, and patient medications and                            allergies were reviewed. The patient's tolerance of                            previous anesthesia was also reviewed. The risks                            and benefits of the procedure and the sedation                            options and risks were discussed with the patient.                            All questions were answered, and informed consent                            was obtained. Prior Anticoagulants: The patient                            last took aspirin 2 days prior to the procedure.                            ASA Grade Assessment: II - A patient with mild                            systemic disease. After reviewing the risks and  benefits, the patient was deemed in satisfactory                            condition to undergo the procedure.                           After obtaining informed consent, the colonoscope                            was passed under direct vision. Throughout the                            procedure, the patient's blood pressure, pulse, and            oxygen saturations were monitored continuously. The                            [id number]) scope was introduced through                            the anus and advanced to the the ileocecal valve.                            The colonoscopy was technically difficult and                            complex due to significant looping and a tortuous                            colon. Successful completion of the procedure was                            aided by colowrap. The patient tolerated the                            procedure well. The quality of the bowel                            preparation was adequate to identify polyps 6 mm                            and larger in size. The ileocecal valve,                            appendiceal orifice, and rectum were photographed. Scope In: 10:56:28 AM Scope Out: 11:38:57 AM Scope Withdrawal Time: 0 hours 7 minutes 42 seconds  Total Procedure Duration: 0 hours 42 minutes 29 seconds  Findings:      The perianal and digital rectal examinations were normal.      The colon (entire examined portion) was redundant.      The colon (entire examined portion) appeared normal.      External and internal hemorrhoids were found during retroflexion. The       hemorrhoids were medium-sized. Impression:               -  Redundant colon.                           - The entire examined colon is normal.                           - External and internal hemorrhoids.                           - No specimens collected. Moderate Sedation:      Moderate (conscious) sedation was administered by the endoscopy nurse       and supervised by the endoscopist. The following parameters were       monitored: oxygen saturation, heart rate, blood pressure, CO2       capnography and response to care. Total physician intraservice time was       49 minutes. Recommendation:           - Patient has a contact number available for                             emergencies. The signs and symptoms of potential                            delayed complications were discussed with the                            patient. Return to normal activities tomorrow.                            Written discharge instructions were provided to the                            patient.                           - Resume previous diet today.                           - Continue present medications.                           - Repeat colonoscopy in 10 years for screening                            purposes. Procedure Code(s):        --- Professional ---                           [phone], Colonoscopy, flexible; diagnostic, including                            collection of specimen(s) by brushing or washing,                            when performed (separate procedure)  [id number], Moderate sedation services provided by the                            same physician or other qualified health care                            professional performing the diagnostic or                            therapeutic service that the sedation supports,                            requiring the presence of an independent trained                            observer to assist in the monitoring of the                            patient's level of consciousness and physiological                            status; initial 15 minutes of intraservice time,                            patient age 35 years or older                           [phone], Moderate sedation services; each additional                            15 minutes intraservice time                           [id number], Moderate sedation services; each additional                            15 minutes intraservice time Diagnosis Code(s):        --- Professional ---                           Z12.11, Encounter for screening for malignant                            neoplasm of colon                           K64.8, Other  hemorrhoids                           Q43.8, Other specified congenital malformations of                            intestine CPT copyright 2016 American Medical Association. All rights reserved. The codes documented in this report are preliminary and upon coder review may  be revised to meet current compliance requirements. Hildred Laser, MD Hildred Laser,  MD 04/29/2016 11:46:43 AM This report has been signed electronically. Number of Addenda: 0

## 2016-04-29 NOTE — Discharge Instructions (Signed)
° °  Colonoscopy, Adult, Care After This sheet gives you information about how to care for yourself after your procedure. Your doctor may also give you more specific instructions. If you have problems or questions, call your doctor. Follow these instructions at home: General instructions  For the first 24 hours after the procedure:  Do not drive or use machinery.  Do not sign important documents.  Do not drink alcohol.  Do your daily activities more slowly than normal.  Eat foods that are soft and easy to digest.  Rest often.  Take over-the-counter or prescription medicines only as told by your doctor.  It is up to you to get the results of your procedure. Ask your doctor, or the department performing the procedure, when your results will be ready. To help cramping and bloating:  Try walking around.  Put heat on your belly (abdomen) as told by your doctor. Use a heat source that your doctor recommends, such as a moist heat pack or a heating pad.  Put a towel between your skin and the heat source.  Leave the heat on for 20-30 minutes.  Remove the heat if your skin turns bright red. This is especially important if you cannot feel pain, heat, or cold. You can get burned. Eating and drinking  Drink enough fluid to keep your pee (urine) clear or pale yellow.  Return to your normal diet as told by your doctor. Avoid heavy or fried foods that are hard to digest.  Avoid drinking alcohol for as long as told by your doctor. Contact a doctor if:  You have blood in your poop (stool) 2-3 days after the procedure. Get help right away if:  You have more than a small amount of blood in your poop.  You see large clumps of tissue (blood clots) in your poop.  Your belly is swollen.  You feel sick to your stomach (nauseous).  You throw up (vomit).  You have a fever.  You have belly pain that gets worse, and medicine does not help your pain. This information is not intended to  replace advice given to you by your health care provider. Make sure you discuss any questions you have with your health care provider. Document Released: 06/20/2010 Document Revised: 02/10/2016 Document Reviewed: 02/10/2016 Elsevier Interactive Patient Education  2017 [place] usual medications and diet. No driving for 24 hours. May consider another screening exam in 10 years.

## 2016-04-29 NOTE — H&P (Addendum)
Jose Shah is an 70 y.o. male.   Chief Complaint: Patient is here for colonoscopy. HPI: Patient is 70 year old Caucasian male who is here for screening colonoscopy. He denies abdominal pain change in bowel habits or rectal bleeding. He states his last exam was in [place] 10 years ago and he was told his colon was long and redundant. History is negative for CRC.  Past Medical History:  Diagnosis Date  . Diabetes mellitus without complication ([place])   . Heart murmur   . Hyperlipidemia   . Hypertension     Past Surgical History:  Procedure Laterality Date  . COLONOSCOPY      Family History  Problem Relation Age of Onset  . Heart disease Mother   . Diabetes Father    Social History:  reports that he has quit smoking. His smoking use included Cigarettes. He has quit using smokeless tobacco. He reports that he does not drink alcohol or use drugs.  Allergies: No Known Allergies  Medications Prior to Admission  Medication Sig Dispense Refill  . Ascorbic Acid (VITAMIN C) 1000 MG tablet Take 1,000 mg by mouth daily.    Marland Kitchen aspirin 81 MG tablet Take 81 mg by mouth daily.    . enalapril (VASOTEC) 10 MG tablet Take 1 tablet (10 mg total) by mouth daily. 90 tablet 1  . glipiZIDE (GLUCOTROL) 5 MG tablet Take 1/2 tablet po BID (Patient taking differently: Take 2.5 mg by mouth 2 (two) times daily before a meal. Take 1/2 tablet po BID) 90 tablet 1  . metFORMIN (GLUCOPHAGE) 1000 MG tablet Take 1 tablet (1,000 mg total) by mouth 2 (two) times daily with a meal. 180 tablet 1  . simvastatin (ZOCOR) 20 MG tablet Take 1 tablet (20 mg total) by mouth every evening. 90 tablet 1  . timolol (TIMOPTIC-XR) 0.5 % ophthalmic gel-forming Place 1 drop into both eyes daily.  0    Results for orders placed or performed during the hospital encounter of 04/29/16 (from the past 48 hour(s))  Glucose, capillary     Status: Abnormal   Collection Time: 04/29/16 10:03 AM  Result Value Ref Range   Glucose-Capillary 173 (H) 65 - 99 mg/dL   No results found.  ROS  Blood pressure (!) 456/86, pulse 71, temperature 98.7 F (37.1 C), temperature source Oral, resp. rate 16, height 5\' 11"  (1.803 m), weight 188 lb (85.3 kg), SpO2 98 %. Physical Exam  Constitutional: He appears well-developed and well-nourished.  HENT:  Mouth/Throat: Oropharynx is clear and moist.  Eyes: Conjunctivae are normal. No scleral icterus.  Neck: No thyromegaly present.  Cardiovascular: Normal rate, regular rhythm and normal heart sounds.   No murmur heard. Respiratory: Effort normal and breath sounds normal.  GI: Soft. He exhibits no distension and no mass. There is no tenderness.  Small umbilical hernia noted.  Musculoskeletal: He exhibits no edema.  Lymphadenopathy:    He has no cervical adenopathy.  Neurological: He is alert.  Skin: Skin is warm and dry.     Assessment/Plan Average risk screening colonoscopy. Since patient has redundant colon will use colowrap.  Hildred Laser, MD 04/29/2016, 10:42 AM

## 2016-05-01 ENCOUNTER — Encounter (HOSPITAL_COMMUNITY): Payer: Self-pay | Admitting: Internal Medicine

## 2016-05-12 ENCOUNTER — Encounter: Payer: Self-pay | Admitting: Family Medicine

## 2016-05-12 ENCOUNTER — Ambulatory Visit (INDEPENDENT_AMBULATORY_CARE_PROVIDER_SITE_OTHER): Payer: Medicare Other | Admitting: Family Medicine

## 2016-05-12 VITALS — BP 118/72 | Ht 72.0 in | Wt 189.0 lb

## 2016-05-12 DIAGNOSIS — I10 Essential (primary) hypertension: Secondary | ICD-10-CM | POA: Diagnosis not present

## 2016-05-12 DIAGNOSIS — E785 Hyperlipidemia, unspecified: Secondary | ICD-10-CM | POA: Diagnosis not present

## 2016-05-12 DIAGNOSIS — Z79899 Other long term (current) drug therapy: Secondary | ICD-10-CM

## 2016-05-12 DIAGNOSIS — E119 Type 2 diabetes mellitus without complications: Secondary | ICD-10-CM | POA: Diagnosis not present

## 2016-05-12 LAB — POCT GLYCOSYLATED HEMOGLOBIN (HGB A1C): HEMOGLOBIN A1C: 5.5

## 2016-05-12 MED ORDER — SIMVASTATIN 20 MG PO TABS: 20.0000 mg | ORAL_TABLET | Freq: Every evening | ORAL | 1 refills | Status: DC

## 2016-05-12 MED ORDER — ENALAPRIL MALEATE 10 MG PO TABS: 10.0000 mg | ORAL_TABLET | Freq: Every day | ORAL | 1 refills | Status: DC

## 2016-05-12 MED ORDER — GLIPIZIDE 5 MG PO TABS: ORAL_TABLET | ORAL | 1 refills | Status: DC

## 2016-05-12 MED ORDER — METFORMIN HCL 1000 MG PO TABS: 1000.0000 mg | ORAL_TABLET | Freq: Two times a day (BID) | ORAL | 1 refills | Status: DC

## 2016-05-12 NOTE — Progress Notes (Signed)
Subjective:     Patient ID: Jose Shah, male   DOB: 07-Jul-1945, 70 y.o.   MRN: [id number]  HPI Patient claims compliance with diabetes medication. No obvious side effects. Reports no substantial low sugar spells. Most numbers are generally in good range when checked fasting. Generally does not miss a dose of medication. Watching diabetic diet closely  Blood pressure medicine and blood pressure levels reviewed today with patient. Compliant with blood pressure medicine. States does not miss a dose. No obvious side effects. Blood pressure generally good when checked elsewhere. Watching salt intake.  Patient continues to take lipid medication regularly. No obvious side effects from it. Generally does not miss a dose. Prior blood work results are reviewed with patient. Patient continues to work on fat intake in diet  Saw eye doc this yr   Review of Systems No headache, no major weight loss or weight gain, no chest pain no back pain abdominal pain no change in bowel habits complete ROS otherwise negative     Objective:   Physical Exam Alert vitals stable, NAD. Blood pressure good on repeat. HEENT normal. Lungs clear. Heart regular rate and rhythm. Distal pulses strong slight distal sensation diminished    Assessment:    impression 1 type 2 diabetes good control discussed maintain same meds #2 hypertension good control discussed maintain same meds #3 hyperlipidemia status uncertain prior blood work discussed maintain same    appropriate blood work. Up to date on vaccines. Recheck in 6 months Plan:

## 2016-05-13 LAB — HEPATIC FUNCTION PANEL
ALK PHOS: 60 IU/L (ref 39–117)
ALT: 16 IU/L (ref 0–44)
AST: 13 IU/L (ref 0–40)
Albumin: 4.6 g/dL (ref 3.5–4.8)
BILIRUBIN, DIRECT: 0.14 mg/dL (ref 0.00–0.40)
Bilirubin Total: 0.6 mg/dL (ref 0.0–1.2)
Total Protein: 7.2 g/dL (ref 6.0–8.5)

## 2016-05-13 LAB — LIPID PANEL
CHOL/HDL RATIO: 3.6 ratio (ref 0.0–5.0)
Cholesterol, Total: 160 mg/dL (ref 100–199)
HDL: 45 mg/dL (ref >39)
LDL Calculated: 97 mg/dL (ref 0–99)
TRIGLYCERIDES: 92 mg/dL (ref 0–149)
VLDL CHOLESTEROL CAL: 18 mg/dL (ref 5–40)

## 2016-05-14 ENCOUNTER — Encounter: Payer: Self-pay | Admitting: Family Medicine

## 2016-11-04 ENCOUNTER — Encounter: Payer: Self-pay | Admitting: Family Medicine

## 2016-11-04 ENCOUNTER — Ambulatory Visit (INDEPENDENT_AMBULATORY_CARE_PROVIDER_SITE_OTHER): Payer: Medicare Other | Admitting: Family Medicine

## 2016-11-04 VITALS — BP 126/78 | Ht 71.0 in | Wt 188.0 lb

## 2016-11-04 DIAGNOSIS — Z0000 Encounter for general adult medical examination without abnormal findings: Secondary | ICD-10-CM | POA: Diagnosis not present

## 2016-11-04 DIAGNOSIS — E7800 Pure hypercholesterolemia, unspecified: Secondary | ICD-10-CM

## 2016-11-04 DIAGNOSIS — E785 Hyperlipidemia, unspecified: Secondary | ICD-10-CM

## 2016-11-04 DIAGNOSIS — E119 Type 2 diabetes mellitus without complications: Secondary | ICD-10-CM | POA: Diagnosis not present

## 2016-11-04 DIAGNOSIS — I10 Essential (primary) hypertension: Secondary | ICD-10-CM | POA: Diagnosis not present

## 2016-11-04 DIAGNOSIS — Z125 Encounter for screening for malignant neoplasm of prostate: Secondary | ICD-10-CM

## 2016-11-04 LAB — POCT GLYCOSYLATED HEMOGLOBIN (HGB A1C): Hemoglobin A1C: 5.9

## 2016-11-04 MED ORDER — METFORMIN HCL 1000 MG PO TABS: 1000.0000 mg | ORAL_TABLET | Freq: Two times a day (BID) | ORAL | 1 refills | Status: DC

## 2016-11-04 MED ORDER — GLIPIZIDE 5 MG PO TABS: ORAL_TABLET | ORAL | 1 refills | Status: DC

## 2016-11-04 MED ORDER — SIMVASTATIN 20 MG PO TABS: 20.0000 mg | ORAL_TABLET | Freq: Every evening | ORAL | 1 refills | Status: DC

## 2016-11-04 MED ORDER — ENALAPRIL MALEATE 10 MG PO TABS: 10.0000 mg | ORAL_TABLET | Freq: Every day | ORAL | 1 refills | Status: DC

## 2016-11-04 NOTE — Progress Notes (Signed)
   Subjective:    Patient ID: Jose Shah, male    DOB: [birth year], 71 y.o.   MRN: [id number]  HPI AWV- Annual Wellness Visit  The patient was seen for their annual wellness visit. The patient's past medical history, surgical history, and family history were reviewed. Pertinent vaccines were reviewed ( tetanus, pneumonia, shingles, flu) The patient's medication list was reviewed and updated.  The height and weight were entered. The patient's current BMI is:  Cognitive screening was completed. Outcome of Mini - Cog: Pass  Falls within the past 6 months: none   Current tobacco usage: None  (All patients who use tobacco were given written and verbal information on quitting)  Recent listing of emergency department/hospitalizations over the past year were reviewed.  current specialist the patient sees on a regular basis: None    Medicare annual wellness visit patient questionnaire was reviewed.  A written screening schedule for the patient for the next 5-10 years was given. Appropriate discussion of followup regarding next visit was discussed.  Patient states no other concerns this visit.   Results for orders placed or performed in visit on 11/04/16  POCT HgB A1C  Result Value Ref Range   Hemoglobin A1C 5.9    Patient claims compliance with diabetes medication. No obvious side effects. Reports no substantial low sugar spells. Most numbers are generally in good range when checked fasting. Generally does not miss a dose of medication. Watching diabetic diet closely  Blood pressure medicine and blood pressure levels reviewed today with patient. Compliant with blood pressure medicine. States does not miss a dose. No obvious side effects. Blood pressure generally good when checked elsewhere. Watching salt intake.   Patient continues to take lipid medication regularly. No obvious side effects from it. Generally does not miss a dose. Prior blood work results are reviewed with patient.  Patient continues to work on fat intake in diet    Review of Systems     Objective:   Physical Exam        Assessment & Plan:

## 2016-11-04 NOTE — Addendum Note (Signed)
Addended by: Mikey Kirschner on: 11/04/2016 09:28 AM   Modules accepted: Level of Service

## 2016-11-04 NOTE — Addendum Note (Signed)
Addended by: Ofilia Neas R on: 11/04/2016 09:50 AM   Modules accepted: Orders

## 2016-11-05 LAB — HEPATIC FUNCTION PANEL
ALK PHOS: 57 IU/L (ref 39–117)
ALT: 16 IU/L (ref 0–44)
AST: 13 IU/L (ref 0–40)
Albumin: 4.9 g/dL — ABNORMAL HIGH (ref 3.5–4.8)
BILIRUBIN, DIRECT: 0.15 mg/dL (ref 0.00–0.40)
Bilirubin Total: 0.5 mg/dL (ref 0.0–1.2)
Total Protein: 6.8 g/dL (ref 6.0–8.5)

## 2016-11-05 LAB — BASIC METABOLIC PANEL
BUN / CREAT RATIO: 12 (ref 10–24)
BUN: 14 mg/dL (ref 8–27)
CHLORIDE: 99 mmol/L (ref 96–106)
CO2: 24 mmol/L (ref 18–29)
Calcium: 9.6 mg/dL (ref 8.6–10.2)
Creatinine, Ser: 1.2 mg/dL (ref 0.76–1.27)
GFR calc Af Amer: 70 mL/min/{1.73_m2} (ref >59)
GFR calc non Af Amer: 60 mL/min/{1.73_m2} (ref >59)
GLUCOSE: 161 mg/dL — AB (ref 65–99)
Potassium: 4.9 mmol/L (ref 3.5–5.2)
SODIUM: 142 mmol/L (ref 134–144)

## 2016-11-05 LAB — LIPID PANEL
CHOL/HDL RATIO: 3.4 ratio (ref 0.0–5.0)
CHOLESTEROL TOTAL: 135 mg/dL (ref 100–199)
HDL: 40 mg/dL (ref >39)
LDL Calculated: 76 mg/dL (ref 0–99)
TRIGLYCERIDES: 96 mg/dL (ref 0–149)
VLDL Cholesterol Cal: 19 mg/dL (ref 5–40)

## 2016-11-05 LAB — MICROALBUMIN / CREATININE URINE RATIO
Creatinine, Urine: 183.4 mg/dL
Microalb/Creat Ratio: 10.7 mg/g creat (ref 0.0–30.0)
Microalbumin, Urine: 19.7 ug/mL

## 2016-11-05 LAB — PSA: PROSTATE SPECIFIC AG, SERUM: 1.5 ng/mL (ref 0.0–4.0)

## 2016-11-15 ENCOUNTER — Encounter: Payer: Self-pay | Admitting: Family Medicine

## 2017-04-12 LAB — HM DIABETES EYE EXAM

## 2017-05-17 ENCOUNTER — Encounter: Payer: Self-pay | Admitting: Family Medicine

## 2017-05-17 ENCOUNTER — Ambulatory Visit (INDEPENDENT_AMBULATORY_CARE_PROVIDER_SITE_OTHER): Payer: Medicare Other | Admitting: Family Medicine

## 2017-05-17 VITALS — BP 126/78 | Ht 71.0 in | Wt 185.0 lb

## 2017-05-17 DIAGNOSIS — E119 Type 2 diabetes mellitus without complications: Secondary | ICD-10-CM

## 2017-05-17 DIAGNOSIS — Z23 Encounter for immunization: Secondary | ICD-10-CM

## 2017-05-17 DIAGNOSIS — I10 Essential (primary) hypertension: Secondary | ICD-10-CM

## 2017-05-17 DIAGNOSIS — E785 Hyperlipidemia, unspecified: Secondary | ICD-10-CM

## 2017-05-17 DIAGNOSIS — Z79899 Other long term (current) drug therapy: Secondary | ICD-10-CM

## 2017-05-17 LAB — POCT GLYCOSYLATED HEMOGLOBIN (HGB A1C): Hemoglobin A1C: 5.5

## 2017-05-17 MED ORDER — SIMVASTATIN 20 MG PO TABS: 20.0000 mg | ORAL_TABLET | Freq: Every evening | ORAL | 1 refills | Status: DC

## 2017-05-17 MED ORDER — ENALAPRIL MALEATE 10 MG PO TABS: 10.0000 mg | ORAL_TABLET | Freq: Every day | ORAL | 1 refills | Status: DC

## 2017-05-17 MED ORDER — METFORMIN HCL 1000 MG PO TABS: 1000.0000 mg | ORAL_TABLET | Freq: Two times a day (BID) | ORAL | 1 refills | Status: DC

## 2017-05-17 MED ORDER — KETOCONAZOLE 2 % EX CREA
1.0000 | TOPICAL_CREAM | Freq: Two times a day (BID) | CUTANEOUS | 2 refills | Status: DC
Start: 2017-05-17 — End: 2017-11-08

## 2017-05-17 MED ORDER — GLIPIZIDE 5 MG PO TABS: ORAL_TABLET | ORAL | 1 refills | Status: DC

## 2017-05-17 NOTE — Progress Notes (Signed)
   Subjective:    Patient ID: Jose Shah, male    DOB: 08-Sep-1945, 71 y.o.   MRN: [id number]  Diabetes  He presents for his follow-up diabetic visit. He has type 2 diabetes mellitus. He is compliant with treatment all of the time. He is following a diabetic diet. He rarely participates in exercise. Home blood sugar record trend: 130's -140's. He does not see a podiatrist.Eye exam is current (5 weeks ago).   Results for orders placed or performed in visit on 05/17/17  POCT glycosylated hemoglobin (Hb A1C)  Result Value Ref Range   Hemoglobin A1C 5.5   HM DIABETES EYE EXAM  Result Value Ref Range   HM Diabetic Eye Exam Retinopathy (A) No Retinopathy    Blood pressure medicine and blood pressure levels reviewed today with patient. Compliant with blood pressure medicine. States does not miss a dose. No obvious side effects. Blood pressure generally good when checked elsewhere. Watching salt intake.   Patient continues to take lipid medication regularly. No obvious side effects from it. Generally does not miss a dose. Prior blood work results are reviewed with patient. Patient continues to work on fat intake in diet  .Patient claims compliance with diabetes medication. No obvious side effects. Reports no substantial low sugar spells. Most numbers are generally in good range when checked fasting. Generally does not miss a dose of medication. Watching diabetic diet closely  occa low sugar spells , very seldom, usually with skipping meal. Feels like dropping a little  Overall doing well recent loss of father  Valentino Saxon to walk reg   Patient arrives with numerous concerns Review of Systems No headache, no major weight loss or weight gain, no chest pain no back pain abdominal pain no change in bowel habits complete ROS otherwise negative     Objective:   Physical Exam  Alert and oriented, vitals reviewed and stable, NAD ENT-TM's and ext canals WNL bilat via otoscopic exam Soft palate,  tonsils and post pharynx WNL via oropharyngeal exam Neck-symmetric, no masses; thyroid nonpalpable and nontender Pulmonary-no tachypnea or accessory muscle use; Clear without wheezes via auscultation Card--no abnrml murmurs, rhythm reg and rate WNL Carotid pulses symmetric, without bruits       Assessment & Plan:  Impression 1 type 2 diabetes controlled type discussed to maintain same meds  2.  Hypertension good control discussed maintain  3.  Discussed insert in both blood work further recommendations based on results  Appropriate blood work diet exercise discussed.  Flu shot medications refilled recheck in 6 months for wellness plus chronic

## 2017-05-18 LAB — HEPATIC FUNCTION PANEL
ALT: 23 IU/L (ref 0–44)
AST: 19 IU/L (ref 0–40)
Albumin: 4.8 g/dL (ref 3.5–4.8)
Alkaline Phosphatase: 54 IU/L (ref 39–117)
BILIRUBIN TOTAL: 0.5 mg/dL (ref 0.0–1.2)
BILIRUBIN, DIRECT: 0.18 mg/dL (ref 0.00–0.40)
Total Protein: 6.6 g/dL (ref 6.0–8.5)

## 2017-05-18 LAB — LIPID PANEL
CHOL/HDL RATIO: 2.9 ratio (ref 0.0–5.0)
CHOLESTEROL TOTAL: 138 mg/dL (ref 100–199)
HDL: 47 mg/dL (ref >39)
LDL CALC: 75 mg/dL (ref 0–99)
Triglycerides: 82 mg/dL (ref 0–149)
VLDL Cholesterol Cal: 16 mg/dL (ref 5–40)

## 2017-05-19 ENCOUNTER — Encounter: Payer: Self-pay | Admitting: Family Medicine

## 2017-11-08 ENCOUNTER — Ambulatory Visit (INDEPENDENT_AMBULATORY_CARE_PROVIDER_SITE_OTHER): Payer: Medicare Other | Admitting: Family Medicine

## 2017-11-08 ENCOUNTER — Encounter: Payer: Self-pay | Admitting: Family Medicine

## 2017-11-08 VITALS — BP 126/82 | Ht 71.0 in | Wt 187.1 lb

## 2017-11-08 DIAGNOSIS — E119 Type 2 diabetes mellitus without complications: Secondary | ICD-10-CM | POA: Diagnosis not present

## 2017-11-08 DIAGNOSIS — Z125 Encounter for screening for malignant neoplasm of prostate: Secondary | ICD-10-CM

## 2017-11-08 DIAGNOSIS — Z79899 Other long term (current) drug therapy: Secondary | ICD-10-CM

## 2017-11-08 DIAGNOSIS — I10 Essential (primary) hypertension: Secondary | ICD-10-CM

## 2017-11-08 DIAGNOSIS — Z13220 Encounter for screening for lipoid disorders: Secondary | ICD-10-CM | POA: Diagnosis not present

## 2017-11-08 DIAGNOSIS — Z0000 Encounter for general adult medical examination without abnormal findings: Secondary | ICD-10-CM

## 2017-11-08 DIAGNOSIS — E785 Hyperlipidemia, unspecified: Secondary | ICD-10-CM | POA: Diagnosis not present

## 2017-11-08 DIAGNOSIS — Z0001 Encounter for general adult medical examination with abnormal findings: Secondary | ICD-10-CM

## 2017-11-08 LAB — POCT GLYCOSYLATED HEMOGLOBIN (HGB A1C): HEMOGLOBIN A1C: 5.1 % (ref 4.0–5.6)

## 2017-11-08 MED ORDER — METFORMIN HCL 1000 MG PO TABS: 1000.0000 mg | ORAL_TABLET | Freq: Two times a day (BID) | ORAL | 1 refills | Status: DC

## 2017-11-08 MED ORDER — ENALAPRIL MALEATE 10 MG PO TABS: 10.0000 mg | ORAL_TABLET | Freq: Every day | ORAL | 1 refills | Status: DC

## 2017-11-08 MED ORDER — KETOCONAZOLE 2 % EX CREA: 1.0000 "application " | TOPICAL_CREAM | Freq: Two times a day (BID) | CUTANEOUS | 2 refills | Status: DC

## 2017-11-08 MED ORDER — SIMVASTATIN 20 MG PO TABS: 20.0000 mg | ORAL_TABLET | Freq: Every evening | ORAL | 1 refills | Status: DC

## 2017-11-08 NOTE — Progress Notes (Signed)
Subjective:    Patient ID: Jose Shah, male    DOB: 1945/11/19, 72 y.o.   MRN: [id number]  HPI  AWV- Annual Wellness Visit  The patient was seen for their annual wellness visit. The patient's past medical history, surgical history, and family history were reviewed. Pertinent vaccines were reviewed ( tetanus, pneumonia, shingles, flu) The patient's medication list was reviewed and updated.  The height and weight were entered.  BMI recorded in electronic record elsewhere  Cognitive screening was completed. Outcome of Mini - MOQ:HUTMLY   Falls /depression screening electronically recorded within record elsewhere  Current tobacco usage:No (All patients who use tobacco were given written and verbal information on quitting)  Recent listing of emergency department/hospitalizations over the past year were reviewed.  current specialist the patient sees on a regular basis:Nothig,but an eye Dr.   Elvin So annual wellness visit patient questionnaire was reviewed.  A written screening schedule for the patient for the next 5-10 years was given. Appropriate discussion of followup regarding next visit was discussed.  Patient claims compliance with diabetes medication. No obvious side effects. Reports no substantial low sugar spells. Most numbers are generally in good range when checked fasting. Generally does not miss a dose of medication. Watching diabetic diet closely  Blood pressure medicine and blood pressure levels reviewed today with patient. Compliant with blood pressure medicine. States does not miss a dose. No obvious side effects. Blood pressure generally good when checked elsewhere. Watching salt intake.   Patient continues to take lipid medication regularly. No obvious side effects from it. Generally does not miss a dose. Prior blood work results are reviewed with patient. Patient continues to work on fat intake in diet   Pt has had cataract surgery, both eyes, ha to take  injections  Walking several times per wk  Now ophthalm watching eyes closely    Review of Systems  Constitutional: Negative for activity change, appetite change and fever.  HENT: Negative for congestion and rhinorrhea.   Eyes: Negative for discharge.  Respiratory: Negative for cough and wheezing.   Cardiovascular: Negative for chest pain.  Gastrointestinal: Negative for abdominal pain, blood in stool and vomiting.  Genitourinary: Negative for difficulty urinating and frequency.  Musculoskeletal: Negative for neck pain.  Skin: Negative for rash.  Allergic/Immunologic: Negative for environmental allergies and food allergies.  Neurological: Negative for weakness and headaches.  Psychiatric/Behavioral: Negative for agitation.  All other systems reviewed and are negative.  Results for orders placed or performed in visit on 11/08/17  POCT glycosylated hemoglobin (Hb A1C)  Result Value Ref Range   Hemoglobin A1C 5.1 4.0 - 5.6 %   HbA1c, POC (prediabetic range)  5.7 - 6.4 %   HbA1c, POC (controlled diabetic range)  0.0 - 7.0 %       Objective:   Physical Exam  Constitutional: He appears well-developed and well-nourished.  HENT:  Head: Normocephalic and atraumatic.  Right Ear: External ear normal.  Left Ear: External ear normal.  Nose: Nose normal.  Mouth/Throat: Oropharynx is clear and moist.  Eyes: Pupils are equal, round, and reactive to light. EOM are normal.  Neck: Normal range of motion. Neck supple. No thyromegaly present.  Cardiovascular: Normal rate, regular rhythm and normal heart sounds.  No murmur heard. Pulmonary/Chest: Effort normal and breath sounds normal. No respiratory distress. He has no wheezes.  Abdominal: Soft. Bowel sounds are normal. He exhibits no distension and no mass. There is no tenderness.  Genitourinary: Penis normal.  Musculoskeletal: Normal range of  motion. He exhibits no edema.  Lymphadenopathy:    He has no cervical adenopathy.    Neurological: He is alert. He exhibits normal muscle tone.  Skin: Skin is warm and dry. No erythema.  Psychiatric: He has a normal mood and affect. His behavior is normal. Judgment normal.          Assessment & Plan:  1 impression wellness exam.  Diet discussed.  Exercise discussed.  Patient states up-to-date on colonoscopy.  Vaccines discussed  2.  Type 2 diabetes.  Control simply to type.  Discussed.  Will stop glipizide rationale discussed.  Patient to maintain metformin  3.  Hypertension.  Good control discussed.  Compliance discussed maintain same meds  4.  Hyperlipidemia prior blood work reviewed discussed to maintain same  Appropriate blood work.  Diet exercise discussed changes in medicines discussed.f u 6 mo

## 2017-11-09 LAB — PSA: Prostate Specific Ag, Serum: 2.1 ng/mL (ref 0.0–4.0)

## 2017-11-09 LAB — LIPID PANEL
CHOL/HDL RATIO: 3 ratio (ref 0.0–5.0)
Cholesterol, Total: 124 mg/dL (ref 100–199)
HDL: 42 mg/dL (ref >39)
LDL CALC: 66 mg/dL (ref 0–99)
TRIGLYCERIDES: 80 mg/dL (ref 0–149)
VLDL Cholesterol Cal: 16 mg/dL (ref 5–40)

## 2017-11-09 LAB — HEPATIC FUNCTION PANEL
ALBUMIN: 4.7 g/dL (ref 3.5–4.8)
ALT: 34 IU/L (ref 0–44)
AST: 16 IU/L (ref 0–40)
Alkaline Phosphatase: 57 IU/L (ref 39–117)
BILIRUBIN TOTAL: 0.5 mg/dL (ref 0.0–1.2)
BILIRUBIN, DIRECT: 0.18 mg/dL (ref 0.00–0.40)
TOTAL PROTEIN: 6.4 g/dL (ref 6.0–8.5)

## 2017-11-09 LAB — BASIC METABOLIC PANEL
BUN / CREAT RATIO: 13 (ref 10–24)
BUN: 16 mg/dL (ref 8–27)
CALCIUM: 9.6 mg/dL (ref 8.6–10.2)
CHLORIDE: 102 mmol/L (ref 96–106)
CO2: 23 mmol/L (ref 20–29)
CREATININE: 1.22 mg/dL (ref 0.76–1.27)
GFR, EST AFRICAN AMERICAN: 68 mL/min/{1.73_m2} (ref >59)
GFR, EST NON AFRICAN AMERICAN: 59 mL/min/{1.73_m2} — AB (ref >59)
Glucose: 161 mg/dL — ABNORMAL HIGH (ref 65–99)
Potassium: 4.8 mmol/L (ref 3.5–5.2)
Sodium: 142 mmol/L (ref 134–144)

## 2017-11-09 LAB — MICROALBUMIN / CREATININE URINE RATIO
Creatinine, Urine: 136.7 mg/dL
MICROALB/CREAT RATIO: 6.8 mg/g{creat} (ref 0.0–30.0)
MICROALBUM., U, RANDOM: 9.3 ug/mL

## 2017-11-15 ENCOUNTER — Encounter: Payer: Self-pay | Admitting: Family Medicine

## 2018-02-15 LAB — HM DIABETES EYE EXAM

## 2018-04-22 ENCOUNTER — Encounter: Payer: Self-pay | Admitting: *Deleted

## 2018-05-10 ENCOUNTER — Ambulatory Visit (INDEPENDENT_AMBULATORY_CARE_PROVIDER_SITE_OTHER): Payer: Medicare Other | Admitting: Family Medicine

## 2018-05-10 ENCOUNTER — Encounter: Payer: Self-pay | Admitting: Family Medicine

## 2018-05-10 VITALS — BP 130/82 | Ht 71.0 in | Wt 180.6 lb

## 2018-05-10 DIAGNOSIS — Z79899 Other long term (current) drug therapy: Secondary | ICD-10-CM

## 2018-05-10 DIAGNOSIS — E119 Type 2 diabetes mellitus without complications: Secondary | ICD-10-CM

## 2018-05-10 DIAGNOSIS — E785 Hyperlipidemia, unspecified: Secondary | ICD-10-CM

## 2018-05-10 DIAGNOSIS — I10 Essential (primary) hypertension: Secondary | ICD-10-CM

## 2018-05-10 LAB — POCT GLYCOSYLATED HEMOGLOBIN (HGB A1C): HEMOGLOBIN A1C: 6.4 % — AB (ref 4.0–5.6)

## 2018-05-10 MED ORDER — KETOCONAZOLE 2 % EX CREA: 1.0000 "application " | TOPICAL_CREAM | Freq: Two times a day (BID) | CUTANEOUS | 2 refills | Status: DC

## 2018-05-10 MED ORDER — ENALAPRIL MALEATE 10 MG PO TABS: 10.0000 mg | ORAL_TABLET | Freq: Every day | ORAL | 1 refills | Status: DC

## 2018-05-10 MED ORDER — METFORMIN HCL 1000 MG PO TABS: 1000.0000 mg | ORAL_TABLET | Freq: Two times a day (BID) | ORAL | 1 refills | Status: DC

## 2018-05-10 MED ORDER — SIMVASTATIN 20 MG PO TABS: 20.0000 mg | ORAL_TABLET | Freq: Every evening | ORAL | 1 refills | Status: DC

## 2018-05-10 MED ORDER — ZOSTER VAC RECOMB ADJUVANTED 50 MCG/0.5ML IM SUSR: 0.5000 mL | Freq: Once | INTRAMUSCULAR | 1 refills | Status: AC

## 2018-05-10 NOTE — Progress Notes (Signed)
   Subjective:    Patient ID: Jose Shah, male    DOB: [birth year], 72 y.o.   MRN: [id number]  Diabetes  He presents for his follow-up diabetic visit. He has type 2 diabetes mellitus. There are no hypoglycemic associated symptoms. There are no diabetic associated symptoms. There are no hypoglycemic complications. There are no diabetic complications.  Hypertension  This is a chronic problem. There are no compliance problems.   Hyperlipidemia  This is a chronic problem. There are no compliance problems.  Risk factors for coronary artery disease include diabetes mellitus.   Pt would like all med refills printed out.  Results for orders placed or performed in visit on 05/10/18  POCT HgB A1C  Result Value Ref Range   Hemoglobin A1C 6.4 (A) 4.0 - 5.6 %   HbA1c POC (<> result, manual entry)     HbA1c, POC (prediabetic range)     HbA1c, POC (controlled diabetic range)     Walking some, staying active,  Blood pressure medicine and blood pressure levels reviewed today with patient. Compliant with blood pressure medicine. States does not miss a dose. No obvious side effects. Blood pressure generally good when checked elsewhere. Watching salt intake.   Patient continues to take lipid medication regularly. No obvious side effects from it. Generally does not miss a dose. Prior blood work results are reviewed with patient. Patient continues to work on fat intake in diet  Patient claims compliance with diabetes medication. No obvious side effects. Reports no substantial low sugar spells. Most numbers are generally in good range when checked fasting. Generally does not miss a dose of medication. Watching diabetic diet closely  Glu cks ar inj the range of 100 ish    Fl shot already given  Review of Systems No headache, no major weight loss or weight gain, no chest pain no back pain abdominal pain no change in bowel habits complete ROS otherwise negative     Objective:   Physical Exam Alert and  oriented, vitals reviewed and stable, NAD ENT-TM's and ext canals WNL bilat via otoscopic exam Soft palate, tonsils and post pharynx WNL via oropharyngeal exam Neck-symmetric, no masses; thyroid nonpalpable and nontender Pulmonary-no tachypnea or accessory muscle use; Clear without wheezes via auscultation Card--no abnrml murmurs, rhythm reg and rate WNL Carotid pulses symmetric, without bruits        Assessment & Plan:  Impression 1 type 2 diabetes.  Control good.  A1c ideal diet discussed compliance discussed  2.  Hypertension.  Good control.  Blood pressure goal reviewed to maintain same meds  3.  Hyperlipidemia.  Current blood work uncertain.  Prior blood work reviewed.  Appropriate order  4.  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  Shingrix vaccine given.  Appropriate blood work in 6 months for wellness plus chronic

## 2018-05-11 LAB — LIPID PANEL
Chol/HDL Ratio: 2.9 ratio (ref 0.0–5.0)
Cholesterol, Total: 126 mg/dL (ref 100–199)
HDL: 43 mg/dL (ref >39)
LDL Calculated: 68 mg/dL (ref 0–99)
Triglycerides: 75 mg/dL (ref 0–149)
VLDL Cholesterol Cal: 15 mg/dL (ref 5–40)

## 2018-05-11 LAB — HEPATIC FUNCTION PANEL
ALT: 23 IU/L (ref 0–44)
AST: 14 IU/L (ref 0–40)
Albumin: 4.4 g/dL (ref 3.5–4.8)
Alkaline Phosphatase: 54 IU/L (ref 39–117)
Bilirubin Total: 0.6 mg/dL (ref 0.0–1.2)
Bilirubin, Direct: 0.2 mg/dL (ref 0.00–0.40)
Total Protein: 6.2 g/dL (ref 6.0–8.5)

## 2018-05-14 ENCOUNTER — Encounter: Payer: Self-pay | Admitting: Family Medicine

## 2018-10-26 ENCOUNTER — Other Ambulatory Visit: Payer: Self-pay

## 2018-10-26 ENCOUNTER — Ambulatory Visit (INDEPENDENT_AMBULATORY_CARE_PROVIDER_SITE_OTHER): Payer: Medicare Other | Admitting: Family Medicine

## 2018-10-26 ENCOUNTER — Encounter: Payer: Self-pay | Admitting: Family Medicine

## 2018-10-26 VITALS — BP 112/72 | Wt 170.0 lb

## 2018-10-26 DIAGNOSIS — I10 Essential (primary) hypertension: Secondary | ICD-10-CM | POA: Diagnosis not present

## 2018-10-26 DIAGNOSIS — E119 Type 2 diabetes mellitus without complications: Secondary | ICD-10-CM

## 2018-10-26 DIAGNOSIS — E785 Hyperlipidemia, unspecified: Secondary | ICD-10-CM | POA: Diagnosis not present

## 2018-10-26 MED ORDER — SIMVASTATIN 20 MG PO TABS: 20.0000 mg | ORAL_TABLET | Freq: Every evening | ORAL | 1 refills | Status: DC

## 2018-10-26 MED ORDER — METFORMIN HCL 1000 MG PO TABS: 1000.0000 mg | ORAL_TABLET | Freq: Two times a day (BID) | ORAL | 1 refills | Status: DC

## 2018-10-26 MED ORDER — ENALAPRIL MALEATE 10 MG PO TABS: 10.0000 mg | ORAL_TABLET | Freq: Every day | ORAL | 1 refills | Status: DC

## 2018-10-26 NOTE — Progress Notes (Signed)
   Subjective:    Patient ID: Jose Shah, male    DOB: [birth year], 73 y.o.   MRN: [id number] Audio only Diabetes  He presents for his follow-up diabetic visit. He has type 2 diabetes mellitus. Risk factors for coronary artery disease include dyslipidemia and diabetes mellitus. Current diabetic treatment includes oral agent (monotherapy). He is compliant with treatment all of the time. His weight is stable. He is following a diabetic diet. He has not had a previous visit with a dietitian. He participates in exercise three times a week. He does not see a podiatrist.Eye exam is current.    Virtual Visit via Video Note  I connected with Jose Shah on 10/26/18 at 11:00 AM EDT by a video enabled telemedicine application and verified that I am speaking with the correct person using two identifiers.  Location: Patient: Home Provider: Office   I discussed the limitations of evaluation and management by telemedicine and the availability of in person appointments. The patient expressed understanding and agreed to proceed.  History of Present Illness:    Observations/Objective:   Assessment and Plan:   Follow Up Instructions:    I discussed the assessment and treatment plan with the patient. The patient was provided an opportunity to ask questions and all were answered. The patient agreed with the plan and demonstrated an understanding of the instructions.   The patient was advised to call back or seek an in-person evaluation if the symptoms worsen or if the condition fails to improve as anticipated.  I provided  79minutes of non-face-to-face time during this encounter. Blood pressure medicine and blood pressure levels reviewed today with patient. Compliant with blood pressure medicine. States does not miss a dose. No obvious side effects. Blood pressure generally good when checked elsewhere. Watching salt intake.   Patient continues to take lipid medication regularly. No obvious side  effects from it. Generally does not miss a dose. Prior blood work results are reviewed with patient. Patient continues to work on fat intake in diet  Patient claims compliance with diabetes medication. No obvious side effects. Reports no substantial low sugar spells. Most numbers are generally in good range when checked fasting. Generally does not miss a dose of medication. Watching diabetic diet closely   128 76     Review of Systems No headache, no major weight loss or weight gain, no chest pain no back pain abdominal pain no change in bowel habits complete ROS otherwise negative     Objective:   Physical Exam Virtual       Assessment & Plan:  Impression 1 type 2 diabetes.  Discussed.  Diet discussed.  Compliance discussed  2.  Hypertension.  Blood pressure good control gives [place] good numbers today.  Patient to maintain same treatment  3.  Hyperlipidemia.  Blood work from 6 months ago reviewed.  Rationale for not doing blood work right now discussed.  Medications refilled  Follow-up in 6 months diet exercise discussed

## 2018-10-26 NOTE — Addendum Note (Signed)
Addended by: Vicente Males on: 10/26/2018 11:00 AM   Modules accepted: Orders

## 2018-12-29 ENCOUNTER — Other Ambulatory Visit: Payer: Self-pay

## 2019-05-01 ENCOUNTER — Ambulatory Visit (INDEPENDENT_AMBULATORY_CARE_PROVIDER_SITE_OTHER): Payer: Medicare Other | Admitting: Family Medicine

## 2019-05-01 ENCOUNTER — Other Ambulatory Visit: Payer: Self-pay

## 2019-05-01 ENCOUNTER — Encounter: Payer: Self-pay | Admitting: Family Medicine

## 2019-05-01 DIAGNOSIS — E119 Type 2 diabetes mellitus without complications: Secondary | ICD-10-CM

## 2019-05-01 DIAGNOSIS — E785 Hyperlipidemia, unspecified: Secondary | ICD-10-CM | POA: Diagnosis not present

## 2019-05-01 DIAGNOSIS — Z125 Encounter for screening for malignant neoplasm of prostate: Secondary | ICD-10-CM

## 2019-05-01 DIAGNOSIS — Z79899 Other long term (current) drug therapy: Secondary | ICD-10-CM

## 2019-05-01 DIAGNOSIS — I10 Essential (primary) hypertension: Secondary | ICD-10-CM

## 2019-05-01 MED ORDER — METFORMIN HCL 1000 MG PO TABS: 1000.0000 mg | ORAL_TABLET | Freq: Two times a day (BID) | ORAL | 1 refills | Status: DC

## 2019-05-01 MED ORDER — ENALAPRIL MALEATE 10 MG PO TABS: 10.0000 mg | ORAL_TABLET | Freq: Every day | ORAL | 1 refills | Status: DC

## 2019-05-01 MED ORDER — SIMVASTATIN 20 MG PO TABS: 20.0000 mg | ORAL_TABLET | Freq: Every evening | ORAL | 1 refills | Status: DC

## 2019-05-01 NOTE — Progress Notes (Signed)
   Subjective:    Patient ID: Jose Shah, male    DOB: Apr 14, 1946, 73 y.o.   MRN: [id number]  Diabetes He presents for his follow-up diabetic visit. He has type 2 diabetes mellitus. Eye exam is current.   Virtual Visit via Telephone Note  I connected with [name] on 05/01/19 at 10:00 AM EST by telephone and verified that I am speaking with the correct person using two identifiers.  Location: Patient: home Provider: office   I discussed the limitations, risks, security and privacy concerns of performing an evaluation and management service by telephone and the availability of in person appointments. I also discussed with the patient that there may be a patient responsible charge related to this service. The patient expressed understanding and agreed to proceed.   History of Present Illness:    Observations/Objective:   Assessment and Plan:   Follow Up Instructions:    I discussed the assessment and treatment plan with the patient. The patient was provided an opportunity to ask questions and all were answered. The patient agreed with the plan and demonstrated an understanding of the instructions.   The patient was advised to call back or seek an in-person evaluation if the symptoms worsen or if the condition fails to improve as anticipated.  I provided 25 minutes of non-face-to-face time during this encounter.   Blood pressure medicine and blood pressure levels reviewed today with patient. Compliant with blood pressure medicine. States does not miss a dose. No obvious side effects. Blood pressure generally good when checked elsewhere. Watching salt intake.   Patient continues to take lipid medication regularly. No obvious side effects from it. Generally does not miss a dose. Prior blood work results are reviewed with patient. Patient continues to work on fat intake in diet  Patient claims compliance with diabetes medication. No obvious side effects. Reports no  substantial low sugar spells. Most numbers are generally in good range when checked fasting. Generally does not miss a dose of medication. Watching diabetic diet closely  Vaccines discussed.  Patient needs Shingrix No. 2.  Flu shot already given    Review of Systems No headache no chest pain no shortness of breath    Objective:   Physical Exam Virtual       Assessment & Plan:  Impression type 2 diabetes.  Glucose is overall decent.  No recent A1c discussed time for blood work pandemic realities discussed  2.  Hypertension.  Blood pressure is good when checked in the past on this dose compliant with meds salt intake discussed diet discussed  3.  Hyperlipidemia status uncertain discussed check appropriate blood work further recommendations based on results  All medications refilled 6 months worth.  Diet exercise discussed  Greater than 50% of this 25 none minute face to face visit was spent in counseling and discussion and coordination of care regarding the above diagnosis/diagnosies

## 2019-05-03 LAB — BASIC METABOLIC PANEL
BUN/Creatinine Ratio: 15 (ref 10–24)
BUN: 17 mg/dL (ref 8–27)
CO2: 24 mmol/L (ref 20–29)
Calcium: 9.6 mg/dL (ref 8.6–10.2)
Chloride: 100 mmol/L (ref 96–106)
Creatinine, Ser: 1.14 mg/dL (ref 0.76–1.27)
GFR calc Af Amer: 73 mL/min/{1.73_m2} (ref >59)
GFR calc non Af Amer: 63 mL/min/{1.73_m2} (ref >59)
Glucose: 152 mg/dL — ABNORMAL HIGH (ref 65–99)
Potassium: 4.7 mmol/L (ref 3.5–5.2)
Sodium: 139 mmol/L (ref 134–144)

## 2019-05-03 LAB — MICROALBUMIN / CREATININE URINE RATIO
Creatinine, Urine: 147.6 mg/dL
Microalb/Creat Ratio: 9 mg/g creat (ref 0–29)
Microalbumin, Urine: 13.3 ug/mL

## 2019-05-03 LAB — HEPATIC FUNCTION PANEL
ALT: 13 IU/L (ref 0–44)
AST: 12 IU/L (ref 0–40)
Albumin: 4.9 g/dL — ABNORMAL HIGH (ref 3.7–4.7)
Alkaline Phosphatase: 58 IU/L (ref 39–117)
Bilirubin Total: 0.6 mg/dL (ref 0.0–1.2)
Bilirubin, Direct: 0.17 mg/dL (ref 0.00–0.40)
Total Protein: 7 g/dL (ref 6.0–8.5)

## 2019-05-03 LAB — LIPID PANEL
Chol/HDL Ratio: 2.8 ratio (ref 0.0–5.0)
Cholesterol, Total: 141 mg/dL (ref 100–199)
HDL: 50 mg/dL (ref >39)
LDL Chol Calc (NIH): 72 mg/dL (ref 0–99)
Triglycerides: 100 mg/dL (ref 0–149)
VLDL Cholesterol Cal: 19 mg/dL (ref 5–40)

## 2019-05-03 LAB — HEMOGLOBIN A1C
Est. average glucose Bld gHb Est-mCnc: 126 mg/dL
Hgb A1c MFr Bld: 6 % — ABNORMAL HIGH (ref 4.8–5.6)

## 2019-05-03 LAB — PSA: Prostate Specific Ag, Serum: 1.6 ng/mL (ref 0.0–4.0)

## 2019-05-07 ENCOUNTER — Encounter: Payer: Self-pay | Admitting: Family Medicine

## 2019-07-06 ENCOUNTER — Encounter: Payer: Self-pay | Admitting: Family Medicine

## 2019-08-08 ENCOUNTER — Telehealth: Payer: Self-pay | Admitting: Family Medicine

## 2019-08-08 MED ORDER — BLOOD GLUCOSE METER KIT: PACK | 0 refills | Status: DC

## 2019-08-08 NOTE — Telephone Encounter (Signed)
Script printed out and awaiting signature. Wife returned call. Pt checking sugars once a day and is currently using Easy Max Meter. Per pt daughter, he is needing a new meter because the one he is using is no longer covered by insurance.

## 2019-08-08 NOTE — Telephone Encounter (Signed)
Left message to return call. Need to know how often pt is checking blood sugar and what type of meter pt has.

## 2019-08-08 NOTE — Telephone Encounter (Signed)
Patient requesting new prescription for lantus and diabetic strips to be called into [place]

## 2019-08-28 ENCOUNTER — Telehealth: Payer: Self-pay | Admitting: Family Medicine

## 2019-08-28 MED ORDER — KETOCONAZOLE 2 % EX CREA: 1.0000 "application " | TOPICAL_CREAM | Freq: Two times a day (BID) | CUTANEOUS | 6 refills | Status: DC

## 2019-08-28 NOTE — Telephone Encounter (Signed)
Please advise. Thank you

## 2019-08-28 NOTE — Telephone Encounter (Signed)
Patient is requesting refill on ketoconazole called into [facility]

## 2019-08-28 NOTE — Telephone Encounter (Signed)
Sure plus 6 ref

## 2019-08-28 NOTE — Telephone Encounter (Signed)
Refills sent in and pt is aware °

## 2019-10-17 ENCOUNTER — Ambulatory Visit (INDEPENDENT_AMBULATORY_CARE_PROVIDER_SITE_OTHER): Payer: Medicare Other | Admitting: Family Medicine

## 2019-10-17 ENCOUNTER — Encounter: Payer: Self-pay | Admitting: Family Medicine

## 2019-10-17 ENCOUNTER — Other Ambulatory Visit: Payer: Self-pay

## 2019-10-17 VITALS — BP 118/82 | Temp 97.8°F | Ht 70.0 in | Wt 168.2 lb

## 2019-10-17 DIAGNOSIS — Z0000 Encounter for general adult medical examination without abnormal findings: Secondary | ICD-10-CM | POA: Diagnosis not present

## 2019-10-17 DIAGNOSIS — E785 Hyperlipidemia, unspecified: Secondary | ICD-10-CM | POA: Diagnosis not present

## 2019-10-17 DIAGNOSIS — E119 Type 2 diabetes mellitus without complications: Secondary | ICD-10-CM

## 2019-10-17 DIAGNOSIS — Z79899 Other long term (current) drug therapy: Secondary | ICD-10-CM

## 2019-10-17 LAB — POCT GLYCOSYLATED HEMOGLOBIN (HGB A1C): Hemoglobin A1C: 5.3 % (ref 4.0–5.6)

## 2019-10-17 MED ORDER — METFORMIN HCL 1000 MG PO TABS: 1000.0000 mg | ORAL_TABLET | Freq: Two times a day (BID) | ORAL | 1 refills | Status: DC

## 2019-10-17 MED ORDER — BLOOD GLUCOSE METER KIT: PACK | 5 refills | Status: DC

## 2019-10-17 MED ORDER — ENALAPRIL MALEATE 10 MG PO TABS: 10.0000 mg | ORAL_TABLET | Freq: Every day | ORAL | 1 refills | Status: DC

## 2019-10-17 MED ORDER — SIMVASTATIN 20 MG PO TABS: 20.0000 mg | ORAL_TABLET | Freq: Every evening | ORAL | 1 refills | Status: DC

## 2019-10-17 NOTE — Progress Notes (Signed)
Subjective:    Patient ID: Jose Shah, male    DOB: [birth year], 74 y.o.   MRN: [id number]  HPI  AWV- Annual Wellness Visit  The patient was seen for their annual wellness visit. The patient's past medical history, surgical history, and family history were reviewed. Pertinent vaccines were reviewed ( tetanus, pneumonia, shingles, flu) The patient's medication list was reviewed and updated.  The height and weight were entered.  BMI recorded in electronic record elsewhere  Cognitive screening was completed. Outcome of Mini - Cog: PASS   Falls /depression screening electronically recorded within record elsewhere.  Current tobacco usage: None (All patients who use tobacco were given written and verbal information on quitting)  Recent listing of emergency department/hospitalizations over the past year were reviewed.  current specialist the patient sees on a regular basis: None   Medicare annual wellness visit patient questionnaire was reviewed.  A written screening schedule for the patient for the next 5-10 years was given. Appropriate discussion of followup regarding next visit was discussed.  Results for orders placed or performed in visit on 10/17/19  POCT glycosylated hemoglobin (Hb A1C)  Result Value Ref Range   Hemoglobin A1C 5.3 4.0 - 5.6 %   HbA1c POC (<> result, manual entry)     HbA1c, POC (prediabetic range)     HbA1c, POC (controlled diabetic range)     Patient claims compliance with diabetes medication. No obvious side effects. Reports no substantial low sugar spells. Most numbers are generally in good range when checked fasting. Generally does not miss a dose of medication. Watching diabetic diet closely  Blood pressure medicine and blood pressure levels reviewed today with patient. Compliant with blood pressure medicine. States does not miss a dose. No obvious side effects. Blood pressure generally good when checked elsewhere. Watching salt  intake.   Patient continues to take lipid medication regularly. No obvious side effects from it. Generally does not miss a dose. Prior blood work results are reviewed with patient. Patient continues to work on fat intake in diet     Review of Systems No headache, no major weight loss or weight gain, no chest pain no back pain abdominal pain no change in bowel habits complete ROS otherwise negative     Objective:   Physical Exam Vitals reviewed.  Constitutional:      Appearance: He is well-developed.  HENT:     Head: Normocephalic and atraumatic.     Right Ear: External ear normal.     Left Ear: External ear normal.     Nose: Nose normal.  Eyes:     Pupils: Pupils are equal, round, and reactive to light.  Neck:     Thyroid: No thyromegaly.  Cardiovascular:     Rate and Rhythm: Normal rate and regular rhythm.     Heart sounds: Normal heart sounds. No murmur.  Pulmonary:     Effort: Pulmonary effort is normal. No respiratory distress.     Breath sounds: Normal breath sounds. No wheezing.  Abdominal:     General: Bowel sounds are normal. There is no distension.     Palpations: Abdomen is soft. There is no mass.     Tenderness: There is no abdominal tenderness.  Genitourinary:    Penis: Normal.   Musculoskeletal:        General: Normal range of motion.     Cervical back: Normal range of motion and neck supple.  Lymphadenopathy:     Cervical: No cervical adenopathy.  Skin:  General: Skin is warm and dry.     Findings: No erythema.  Neurological:     Mental Status: He is alert.     Motor: No abnormal muscle tone.  Psychiatric:        Behavior: Behavior normal.        Judgment: Judgment normal.    Prostate exam within normal limits  Feet sensation intact pulses good       Assessment & Plan:  Impression wellness.  Last colon done in 2017.  Next one due in 6 years.  Diet discussed exercise discussed.  Diabetes and hypertension excellent control medications  refilled.  Due to get Covid vaccines soon.  Follow-up in 6 months

## 2019-10-18 LAB — LIPID PANEL
Chol/HDL Ratio: 3 ratio (ref 0.0–5.0)
Cholesterol, Total: 142 mg/dL (ref 100–199)
HDL: 47 mg/dL (ref >39)
LDL Chol Calc (NIH): 76 mg/dL (ref 0–99)
Triglycerides: 105 mg/dL (ref 0–149)
VLDL Cholesterol Cal: 19 mg/dL (ref 5–40)

## 2019-10-18 LAB — HEPATIC FUNCTION PANEL
ALT: 13 IU/L (ref 0–44)
AST: 14 IU/L (ref 0–40)
Albumin: 5 g/dL — ABNORMAL HIGH (ref 3.7–4.7)
Alkaline Phosphatase: 53 IU/L (ref 48–121)
Bilirubin Total: 0.6 mg/dL (ref 0.0–1.2)
Bilirubin, Direct: 0.21 mg/dL (ref 0.00–0.40)
Total Protein: 6.5 g/dL (ref 6.0–8.5)

## 2019-10-19 ENCOUNTER — Encounter: Payer: Self-pay | Admitting: Family Medicine

## 2020-02-08 LAB — HM DIABETES EYE EXAM

## 2020-04-01 ENCOUNTER — Other Ambulatory Visit: Payer: Self-pay | Admitting: *Deleted

## 2020-04-01 ENCOUNTER — Telehealth: Payer: Self-pay

## 2020-04-01 DIAGNOSIS — Z125 Encounter for screening for malignant neoplasm of prostate: Secondary | ICD-10-CM

## 2020-04-01 DIAGNOSIS — Z79899 Other long term (current) drug therapy: Secondary | ICD-10-CM

## 2020-04-01 DIAGNOSIS — E785 Hyperlipidemia, unspecified: Secondary | ICD-10-CM

## 2020-04-01 DIAGNOSIS — E119 Type 2 diabetes mellitus without complications: Secondary | ICD-10-CM

## 2020-04-01 DIAGNOSIS — I10 Essential (primary) hypertension: Secondary | ICD-10-CM

## 2020-04-01 NOTE — Telephone Encounter (Signed)
Lipid, liver, metabolic 7, H4F, PSA, urine ACR Hyperlipidemia hypertension diabetes screening thank you

## 2020-04-01 NOTE — Telephone Encounter (Signed)
Pt made appt on 11/19 for 6 month follow up and med check wanting to know if blood work needs to be done.   Pt call back [phone]

## 2020-04-01 NOTE — Telephone Encounter (Signed)
Last labs 10/17/19 lipid, liver, a1c

## 2020-04-01 NOTE — Telephone Encounter (Signed)
Labs ordered, pt notified.

## 2020-04-10 LAB — LIPID PANEL
Chol/HDL Ratio: 2.7 ratio (ref 0.0–5.0)
Cholesterol, Total: 128 mg/dL (ref 100–199)
HDL: 48 mg/dL (ref >39)
LDL Chol Calc (NIH): 64 mg/dL (ref 0–99)
Triglycerides: 79 mg/dL (ref 0–149)
VLDL Cholesterol Cal: 16 mg/dL (ref 5–40)

## 2020-04-10 LAB — HEPATIC FUNCTION PANEL
ALT: 13 IU/L (ref 0–44)
AST: 14 IU/L (ref 0–40)
Albumin: 4.5 g/dL (ref 3.7–4.7)
Alkaline Phosphatase: 58 IU/L (ref 44–121)
Bilirubin Total: 0.6 mg/dL (ref 0.0–1.2)
Bilirubin, Direct: 0.21 mg/dL (ref 0.00–0.40)
Total Protein: 6.1 g/dL (ref 6.0–8.5)

## 2020-04-10 LAB — MICROALBUMIN / CREATININE URINE RATIO
Creatinine, Urine: 167.7 mg/dL
Microalb/Creat Ratio: 8 mg/g creat (ref 0–29)
Microalbumin, Urine: 13.9 ug/mL

## 2020-04-10 LAB — BASIC METABOLIC PANEL
BUN/Creatinine Ratio: 16 (ref 10–24)
BUN: 19 mg/dL (ref 8–27)
CO2: 23 mmol/L (ref 20–29)
Calcium: 9.7 mg/dL (ref 8.6–10.2)
Chloride: 105 mmol/L (ref 96–106)
Creatinine, Ser: 1.22 mg/dL (ref 0.76–1.27)
GFR calc Af Amer: 67 mL/min/{1.73_m2} (ref >59)
GFR calc non Af Amer: 58 mL/min/{1.73_m2} — ABNORMAL LOW (ref >59)
Glucose: 156 mg/dL — ABNORMAL HIGH (ref 65–99)
Potassium: 4.9 mmol/L (ref 3.5–5.2)
Sodium: 144 mmol/L (ref 134–144)

## 2020-04-10 LAB — PSA: Prostate Specific Ag, Serum: 1.5 ng/mL (ref 0.0–4.0)

## 2020-04-10 LAB — HEMOGLOBIN A1C
Est. average glucose Bld gHb Est-mCnc: 140 mg/dL
Hgb A1c MFr Bld: 6.5 % — ABNORMAL HIGH (ref 4.8–5.6)

## 2020-04-19 ENCOUNTER — Encounter: Payer: Self-pay | Admitting: Family Medicine

## 2020-04-19 ENCOUNTER — Other Ambulatory Visit: Payer: Self-pay

## 2020-04-19 ENCOUNTER — Ambulatory Visit (INDEPENDENT_AMBULATORY_CARE_PROVIDER_SITE_OTHER): Payer: Medicare Other | Admitting: Family Medicine

## 2020-04-19 VITALS — BP 138/80 | HR 70 | Temp 97.1°F | Ht 70.0 in | Wt 173.0 lb

## 2020-04-19 DIAGNOSIS — E1169 Type 2 diabetes mellitus with other specified complication: Secondary | ICD-10-CM | POA: Diagnosis not present

## 2020-04-19 DIAGNOSIS — Z23 Encounter for immunization: Secondary | ICD-10-CM | POA: Diagnosis not present

## 2020-04-19 DIAGNOSIS — E785 Hyperlipidemia, unspecified: Secondary | ICD-10-CM | POA: Diagnosis not present

## 2020-04-19 DIAGNOSIS — I10 Essential (primary) hypertension: Secondary | ICD-10-CM

## 2020-04-19 DIAGNOSIS — L209 Atopic dermatitis, unspecified: Secondary | ICD-10-CM | POA: Diagnosis not present

## 2020-04-19 MED ORDER — ENALAPRIL MALEATE 10 MG PO TABS
10.0000 mg | ORAL_TABLET | Freq: Every day | ORAL | 1 refills | Status: DC
Start: 2020-04-19 — End: 2020-10-21

## 2020-04-19 MED ORDER — METFORMIN HCL 1000 MG PO TABS
1000.0000 mg | ORAL_TABLET | Freq: Two times a day (BID) | ORAL | 1 refills | Status: DC
Start: 2020-04-19 — End: 2020-10-21

## 2020-04-19 MED ORDER — SIMVASTATIN 20 MG PO TABS: 20.0000 mg | ORAL_TABLET | Freq: Every evening | ORAL | 1 refills | Status: DC

## 2020-04-19 MED ORDER — TRIAMCINOLONE ACETONIDE 0.1 % EX CREA: TOPICAL_CREAM | CUTANEOUS | 1 refills | Status: DC

## 2020-04-19 NOTE — Patient Instructions (Signed)
Results for orders placed or performed in visit on 04/01/20  Lipid panel  Result Value Ref Range   Cholesterol, Total 128 100 - 199 mg/dL   Triglycerides 79 0 - 149 mg/dL   HDL 48 >39 mg/dL   VLDL Cholesterol Cal 16 5 - 40 mg/dL   LDL Chol Calc (NIH) 64 0 - 99 mg/dL   Chol/HDL Ratio 2.7 0.0 - 5.0 ratio  Hepatic function panel  Result Value Ref Range   Total Protein 6.1 6.0 - 8.5 g/dL   Albumin 4.5 3.7 - 4.7 g/dL   Bilirubin Total 0.6 0.0 - 1.2 mg/dL   Bilirubin, Direct 0.21 0.00 - 0.40 mg/dL   Alkaline Phosphatase 58 44 - 121 IU/L   AST 14 0 - 40 IU/L   ALT 13 0 - 44 IU/L  Basic metabolic panel  Result Value Ref Range   Glucose 156 (H) 65 - 99 mg/dL   BUN 19 8 - 27 mg/dL   Creatinine, Ser 1.22 0.76 - 1.27 mg/dL   GFR calc non Af Amer 58 (L) >59 mL/min/1.73   GFR calc Af Amer 67 >59 mL/min/1.73   BUN/Creatinine Ratio 16 10 - 24   Sodium 144 134 - 144 mmol/L   Potassium 4.9 3.5 - 5.2 mmol/L   Chloride 105 96 - 106 mmol/L   CO2 23 20 - 29 mmol/L   Calcium 9.7 8.6 - 10.2 mg/dL  Hemoglobin A1c  Result Value Ref Range   Hgb A1c MFr Bld 6.5 (H) 4.8 - 5.6 %   Est. average glucose Bld gHb Est-mCnc 140 mg/dL  PSA  Result Value Ref Range   Prostate Specific Ag, Serum 1.5 0.0 - 4.0 ng/mL  Microalbumin / creatinine urine ratio  Result Value Ref Range   Creatinine, Urine 167.7 Not Estab. mg/dL   Microalbumin, Urine 13.9 Not Estab. ug/mL   Microalb/Creat Ratio 8 0 - 29 mg/g creat

## 2020-04-19 NOTE — Progress Notes (Signed)
Subjective:    Patient ID: Jose Shah, male    DOB: 03/03/1946, 74 y.o.   MRN: [id number]  Diabetes He presents for his follow-up diabetic visit. He has type 2 diabetes mellitus. Pertinent negatives for hypoglycemia include no confusion, dizziness or headaches. Pertinent negatives for diabetes include no chest pain and no fatigue. Current diabetic treatments: metformin. He is compliant with treatment all of the time. Eye exam is current.   Fall Risk  04/19/2020 10/17/2019 12/29/2018 11/08/2017 11/04/2016  Falls in the past year? 0 0 0 No No  Comment - - Emmi Telephone Survey: data to providers prior to load - -  Follow up Falls evaluation completed Falls evaluation completed - - -    Would like senior flu vaccine.  Results for orders placed or performed in visit on 04/01/20  Lipid panel  Result Value Ref Range   Cholesterol, Total 128 100 - 199 mg/dL   Triglycerides 79 0 - 149 mg/dL   HDL 48 >39 mg/dL   VLDL Cholesterol Cal 16 5 - 40 mg/dL   LDL Chol Calc (NIH) 64 0 - 99 mg/dL   Chol/HDL Ratio 2.7 0.0 - 5.0 ratio  Hepatic function panel  Result Value Ref Range   Total Protein 6.1 6.0 - 8.5 g/dL   Albumin 4.5 3.7 - 4.7 g/dL   Bilirubin Total 0.6 0.0 - 1.2 mg/dL   Bilirubin, Direct 0.21 0.00 - 0.40 mg/dL   Alkaline Phosphatase 58 44 - 121 IU/L   AST 14 0 - 40 IU/L   ALT 13 0 - 44 IU/L  Basic metabolic panel  Result Value Ref Range   Glucose 156 (H) 65 - 99 mg/dL   BUN 19 8 - 27 mg/dL   Creatinine, Ser 1.22 0.76 - 1.27 mg/dL   GFR calc non Af Amer 58 (L) >59 mL/min/1.73   GFR calc Af Amer 67 >59 mL/min/1.73   BUN/Creatinine Ratio 16 10 - 24   Sodium 144 134 - 144 mmol/L   Potassium 4.9 3.5 - 5.2 mmol/L   Chloride 105 96 - 106 mmol/L   CO2 23 20 - 29 mmol/L   Calcium 9.7 8.6 - 10.2 mg/dL  Hemoglobin A1c  Result Value Ref Range   Hgb A1c MFr Bld 6.5 (H) 4.8 - 5.6 %   Est. average glucose Bld gHb Est-mCnc 140 mg/dL  PSA  Result Value Ref Range   Prostate Specific Ag,  Serum 1.5 0.0 - 4.0 ng/mL  Microalbumin / creatinine urine ratio  Result Value Ref Range   Creatinine, Urine 167.7 Not Estab. mg/dL   Microalbumin, Urine 13.9 Not Estab. ug/mL   Microalb/Creat Ratio 8 0 - 29 mg/g creat     Review of Systems  Constitutional: Negative for diaphoresis and fatigue.  HENT: Negative for congestion and rhinorrhea.   Respiratory: Negative for cough and shortness of breath.   Cardiovascular: Negative for chest pain and leg swelling.  Gastrointestinal: Negative for abdominal pain and diarrhea.  Skin: Negative for color change and rash.  Neurological: Negative for dizziness and headaches.  Psychiatric/Behavioral: Negative for behavioral problems and confusion.       Objective:   Physical Exam Vitals reviewed.  Constitutional:      General: He is not in acute distress. HENT:     Head: Normocephalic and atraumatic.  Eyes:     General:        Right eye: No discharge.        Left eye: No discharge.  Neck:  Trachea: No tracheal deviation.  Cardiovascular:     Rate and Rhythm: Normal rate and regular rhythm.     Heart sounds: Normal heart sounds. No murmur heard.   Pulmonary:     Effort: Pulmonary effort is normal. No respiratory distress.     Breath sounds: Normal breath sounds.  Lymphadenopathy:     Cervical: No cervical adenopathy.  Skin:    General: Skin is warm and dry.  Neurological:     Mental Status: He is alert.     Coordination: Coordination normal.  Psychiatric:        Behavior: Behavior normal.           Assessment & Plan:  1. Hyperlipidemia associated with type 2 diabetes mellitus ([place]) Recent labs under good control continue current measures watch diet stay active  2. Essential hypertension, benign Blood pressure good control continue current measures minimize salt diet stay active  3. Atopic dermatitis, unspecified type Atopic dermatitis noted on the leg steroid cream as directed  Diabetes A1c respectable continue  current effort watch diet closely stay active  4. Need for vaccination Flu shot today - Flu Vaccine QUAD High Dose(Fluad)

## 2020-09-04 ENCOUNTER — Telehealth: Payer: Self-pay

## 2020-09-04 DIAGNOSIS — I10 Essential (primary) hypertension: Secondary | ICD-10-CM

## 2020-09-04 DIAGNOSIS — E785 Hyperlipidemia, unspecified: Secondary | ICD-10-CM

## 2020-09-04 DIAGNOSIS — E119 Type 2 diabetes mellitus without complications: Secondary | ICD-10-CM

## 2020-09-04 DIAGNOSIS — E1169 Type 2 diabetes mellitus with other specified complication: Secondary | ICD-10-CM

## 2020-09-04 NOTE — Telephone Encounter (Signed)
Last labs completed 04/09/20 Urine Micro, PSA, A1C, BMET, Hepatic and Lipid. Please advise. Thank you

## 2020-09-04 NOTE — Telephone Encounter (Signed)
A1c, CMP, lipid-hyperlipidemia diabetes hypertension

## 2020-09-04 NOTE — Telephone Encounter (Signed)
Pt has 6 month follow up on May the 23rd and needs blood work ordered.   Pt call back [phone]

## 2020-09-05 NOTE — Telephone Encounter (Signed)
Bw orders put in and pt was notified.

## 2020-10-02 ENCOUNTER — Telehealth: Payer: Self-pay | Admitting: Family Medicine

## 2020-10-02 NOTE — Telephone Encounter (Signed)
Left message for patient to schedule Annual Wellness Visit.  Please schedule with Nurse Health Advisor Shannon Crews, RN at [facility]  

## 2020-10-03 LAB — COMPREHENSIVE METABOLIC PANEL
ALT: 15 IU/L (ref 0–44)
AST: 11 IU/L (ref 0–40)
Albumin/Globulin Ratio: 2.8 — ABNORMAL HIGH (ref 1.2–2.2)
Albumin: 4.7 g/dL (ref 3.7–4.7)
Alkaline Phosphatase: 48 IU/L (ref 44–121)
BUN/Creatinine Ratio: 17 (ref 10–24)
BUN: 18 mg/dL (ref 8–27)
Bilirubin Total: 0.6 mg/dL (ref 0.0–1.2)
CO2: 23 mmol/L (ref 20–29)
Calcium: 9.3 mg/dL (ref 8.6–10.2)
Chloride: 102 mmol/L (ref 96–106)
Creatinine, Ser: 1.09 mg/dL (ref 0.76–1.27)
Globulin, Total: 1.7 g/dL (ref 1.5–4.5)
Glucose: 135 mg/dL — ABNORMAL HIGH (ref 65–99)
Potassium: 4.5 mmol/L (ref 3.5–5.2)
Sodium: 139 mmol/L (ref 134–144)
Total Protein: 6.4 g/dL (ref 6.0–8.5)
eGFR: 71 mL/min/{1.73_m2} (ref >59)

## 2020-10-03 LAB — LIPID PANEL
Chol/HDL Ratio: 2.7 ratio (ref 0.0–5.0)
Cholesterol, Total: 131 mg/dL (ref 100–199)
HDL: 48 mg/dL (ref >39)
LDL Chol Calc (NIH): 67 mg/dL (ref 0–99)
Triglycerides: 81 mg/dL (ref 0–149)
VLDL Cholesterol Cal: 16 mg/dL (ref 5–40)

## 2020-10-03 LAB — HEMOGLOBIN A1C
Est. average glucose Bld gHb Est-mCnc: 134 mg/dL
Hgb A1c MFr Bld: 6.3 % — ABNORMAL HIGH (ref 4.8–5.6)

## 2020-10-21 ENCOUNTER — Ambulatory Visit (INDEPENDENT_AMBULATORY_CARE_PROVIDER_SITE_OTHER): Payer: Medicare Other | Admitting: Family Medicine

## 2020-10-21 ENCOUNTER — Other Ambulatory Visit: Payer: Self-pay

## 2020-10-21 ENCOUNTER — Encounter: Payer: Self-pay | Admitting: Family Medicine

## 2020-10-21 VITALS — BP 118/74 | HR 72 | Temp 98.0°F | Wt 172.8 lb

## 2020-10-21 DIAGNOSIS — Z125 Encounter for screening for malignant neoplasm of prostate: Secondary | ICD-10-CM

## 2020-10-21 DIAGNOSIS — E119 Type 2 diabetes mellitus without complications: Secondary | ICD-10-CM | POA: Diagnosis not present

## 2020-10-21 DIAGNOSIS — E785 Hyperlipidemia, unspecified: Secondary | ICD-10-CM

## 2020-10-21 DIAGNOSIS — E1169 Type 2 diabetes mellitus with other specified complication: Secondary | ICD-10-CM | POA: Diagnosis not present

## 2020-10-21 DIAGNOSIS — I10 Essential (primary) hypertension: Secondary | ICD-10-CM | POA: Diagnosis not present

## 2020-10-21 MED ORDER — ENALAPRIL MALEATE 10 MG PO TABS
10.0000 mg | ORAL_TABLET | Freq: Every day | ORAL | 1 refills | Status: DC
Start: 2020-10-21 — End: 2021-04-28

## 2020-10-21 MED ORDER — KETOCONAZOLE 2 % EX CREA: 1.0000 "application " | TOPICAL_CREAM | Freq: Two times a day (BID) | CUTANEOUS | 6 refills | Status: AC

## 2020-10-21 MED ORDER — METFORMIN HCL 1000 MG PO TABS
1000.0000 mg | ORAL_TABLET | Freq: Two times a day (BID) | ORAL | 1 refills | Status: DC
Start: 2020-10-21 — End: 2021-04-28

## 2020-10-21 MED ORDER — SIMVASTATIN 20 MG PO TABS: 20.0000 mg | ORAL_TABLET | Freq: Every evening | ORAL | 1 refills | Status: DC

## 2020-10-21 NOTE — Progress Notes (Signed)
Subjective:    Patient ID: Jose Shah, male    DOB: 07-08-1945, 75 y.o.   MRN: [id number]  Diabetes He presents for his follow-up diabetic visit. He has type 2 diabetes mellitus. There are no hypoglycemic associated symptoms. There are no diabetic associated symptoms. There are no hypoglycemic complications. There are no diabetic complications. Risk factors for coronary artery disease include male sex. Current diabetic treatment includes oral agent (monotherapy). He is compliant with treatment all of the time. He does not see a podiatrist.Eye exam is current.  pt would like would like scripts printed today due to unsure of pharmacy.   Patient here for follow-up regarding cholesterol.  The patient does have hyperlipidemia.  Patient does try to maintain a reasonable diet.  Patient does take the medication on a regular basis.  Denies missing a dose.  The patient denies any obvious side effects.  Prior blood work results reviewed with the patient.  The patient is aware of his cholesterol goals and the need to keep it under good control to lessen the risk of disease.  Patient for blood pressure check up.  The patient does have hypertension.  The patient is on medication.  Patient relates compliance with meds. Todays BP reviewed with the patient. Patient denies issues with medication. Patient relates reasonable diet. Patient tries to minimize salt. Patient aware of BP goals.   Review of Systems     Objective:   Physical Exam Vitals reviewed.  Constitutional:      General: He is not in acute distress. HENT:     Head: Normocephalic and atraumatic.  Eyes:     General:        Right eye: No discharge.        Left eye: No discharge.  Neck:     Trachea: No tracheal deviation.  Cardiovascular:     Rate and Rhythm: Normal rate and regular rhythm.     Heart sounds: Normal heart sounds. No murmur heard.   Pulmonary:     Effort: Pulmonary effort is normal. No respiratory distress.     Breath  sounds: Normal breath sounds.  Lymphadenopathy:     Cervical: No cervical adenopathy.  Skin:    General: Skin is warm and dry.  Neurological:     Mental Status: He is alert.     Coordination: Coordination normal.  Psychiatric:        Behavior: Behavior normal.   Prostate exam not done today  Results for orders placed or performed in visit on 09/04/20  Lipid panel  Result Value Ref Range   Cholesterol, Total 131 100 - 199 mg/dL   Triglycerides 81 0 - 149 mg/dL   HDL 48 >39 mg/dL   VLDL Cholesterol Cal 16 5 - 40 mg/dL   LDL Chol Calc (NIH) 67 0 - 99 mg/dL   Chol/HDL Ratio 2.7 0.0 - 5.0 ratio  Comprehensive metabolic panel  Result Value Ref Range   Glucose 135 (H) 65 - 99 mg/dL   BUN 18 8 - 27 mg/dL   Creatinine, Ser 1.09 0.76 - 1.27 mg/dL   eGFR 71 >59 mL/min/1.73   BUN/Creatinine Ratio 17 10 - 24   Sodium 139 134 - 144 mmol/L   Potassium 4.5 3.5 - 5.2 mmol/L   Chloride 102 96 - 106 mmol/L   CO2 23 20 - 29 mmol/L   Calcium 9.3 8.6 - 10.2 mg/dL   Total Protein 6.4 6.0 - 8.5 g/dL   Albumin 4.7 3.7 - 4.7 g/dL  Globulin, Total 1.7 1.5 - 4.5 g/dL   Albumin/Globulin Ratio 2.8 (H) 1.2 - 2.2   Bilirubin Total 0.6 0.0 - 1.2 mg/dL   Alkaline Phosphatase 48 44 - 121 IU/L   AST 11 0 - 40 IU/L   ALT 15 0 - 44 IU/L  Hemoglobin A1c  Result Value Ref Range   Hgb A1c MFr Bld 6.3 (H) 4.8 - 5.6 %   Est. average glucose Bld gHb Est-mCnc 134 mg/dL    Labs were reviewed with the patient     Assessment & Plan:  1. Hyperlipidemia associated with type 2 diabetes mellitus (HCC) Previous A1c look good.  Patient will check lab work before next visit He will continue to try to eat healthy and take his medications - Hemoglobin L8X - Basic Metabolic Panel (BMET) - PSA - Urine Microalbumin w/creat. ratio  2. Essential hypertension, benign Blood pressure good control today continue current measures watch diet - Hemoglobin Q1J - Basic Metabolic Panel (BMET) - PSA - Urine Microalbumin  w/creat. ratio  3. Diabetes mellitus without complication ([place]) Diabetes under good control watch diet continue current measures - Hemoglobin H4R - Basic Metabolic Panel (BMET) - PSA - Urine Microalbumin w/creat. ratio  4. Screening PSA (prostate specific antigen) Screening PSA before next visit - Hemoglobin D4Y - Basic Metabolic Panel (BMET) - PSA - Urine Microalbumin w/creat. ratio  We will do wellness visit in diabetes follow-up in 6 months

## 2020-10-21 NOTE — Patient Instructions (Addendum)
Results for orders placed or performed in visit on 09/04/20  Lipid panel  Result Value Ref Range   Cholesterol, Total 131 100 - 199 mg/dL   Triglycerides 81 0 - 149 mg/dL   HDL 48 >39 mg/dL   VLDL Cholesterol Cal 16 5 - 40 mg/dL   LDL Chol Calc (NIH) 67 0 - 99 mg/dL   Chol/HDL Ratio 2.7 0.0 - 5.0 ratio  Comprehensive metabolic panel  Result Value Ref Range   Glucose 135 (H) 65 - 99 mg/dL   BUN 18 8 - 27 mg/dL   Creatinine, Ser 1.09 0.76 - 1.27 mg/dL   eGFR 71 >59 mL/min/1.73   BUN/Creatinine Ratio 17 10 - 24   Sodium 139 134 - 144 mmol/L   Potassium 4.5 3.5 - 5.2 mmol/L   Chloride 102 96 - 106 mmol/L   CO2 23 20 - 29 mmol/L   Calcium 9.3 8.6 - 10.2 mg/dL   Total Protein 6.4 6.0 - 8.5 g/dL   Albumin 4.7 3.7 - 4.7 g/dL   Globulin, Total 1.7 1.5 - 4.5 g/dL   Albumin/Globulin Ratio 2.8 (H) 1.2 - 2.2   Bilirubin Total 0.6 0.0 - 1.2 mg/dL   Alkaline Phosphatase 48 44 - 121 IU/L   AST 11 0 - 40 IU/L   ALT 15 0 - 44 IU/L  Hemoglobin A1c  Result Value Ref Range   Hgb A1c MFr Bld 6.3 (H) 4.8 - 5.6 %   Est. average glucose Bld gHb Est-mCnc 134 mg/dL   Stop your aspirin please  Recheck here in 6 months We will have you do blood work before that visit

## 2021-02-05 LAB — BASIC METABOLIC PANEL
BUN/Creatinine Ratio: 17 (ref 10–24)
BUN: 20 mg/dL (ref 8–27)
CO2: 23 mmol/L (ref 20–29)
Calcium: 9.4 mg/dL (ref 8.6–10.2)
Chloride: 102 mmol/L (ref 96–106)
Creatinine, Ser: 1.18 mg/dL (ref 0.76–1.27)
Glucose: 164 mg/dL — ABNORMAL HIGH (ref 65–99)
Potassium: 4.7 mmol/L (ref 3.5–5.2)
Sodium: 140 mmol/L (ref 134–144)
eGFR: 64 mL/min/{1.73_m2} (ref >59)

## 2021-02-05 LAB — MICROALBUMIN / CREATININE URINE RATIO
Creatinine, Urine: 169.9 mg/dL
Microalb/Creat Ratio: 16 mg/g creat (ref 0–29)
Microalbumin, Urine: 27.8 ug/mL

## 2021-02-05 LAB — HEMOGLOBIN A1C
Est. average glucose Bld gHb Est-mCnc: 137 mg/dL
Hgb A1c MFr Bld: 6.4 % — ABNORMAL HIGH (ref 4.8–5.6)

## 2021-02-05 LAB — PSA: Prostate Specific Ag, Serum: 1.5 ng/mL (ref 0.0–4.0)

## 2021-02-09 ENCOUNTER — Encounter: Payer: Self-pay | Admitting: Family Medicine

## 2021-04-08 ENCOUNTER — Telehealth: Payer: Self-pay | Admitting: Family Medicine

## 2021-04-08 NOTE — Telephone Encounter (Signed)
Left message for patient to call back and schedule Medicare Annual Wellness Visit (AWV) in office.   If unable to come into the office for AWV,  please offer to do virtually or by telephone.  Last AWV: 10/17/2019  Please schedule at anytime with [facility].  40 minute appointment  Any questions, please contact me at [phone]

## 2021-04-28 ENCOUNTER — Encounter: Payer: Self-pay | Admitting: Family Medicine

## 2021-04-28 ENCOUNTER — Ambulatory Visit (INDEPENDENT_AMBULATORY_CARE_PROVIDER_SITE_OTHER): Payer: Medicare Other | Admitting: Family Medicine

## 2021-04-28 ENCOUNTER — Other Ambulatory Visit: Payer: Self-pay

## 2021-04-28 VITALS — BP 114/76 | Temp 97.4°F | Wt 166.2 lb

## 2021-04-28 DIAGNOSIS — E785 Hyperlipidemia, unspecified: Secondary | ICD-10-CM

## 2021-04-28 DIAGNOSIS — I10 Essential (primary) hypertension: Secondary | ICD-10-CM

## 2021-04-28 DIAGNOSIS — E1169 Type 2 diabetes mellitus with other specified complication: Secondary | ICD-10-CM | POA: Diagnosis not present

## 2021-04-28 MED ORDER — ENALAPRIL MALEATE 10 MG PO TABS: 10.0000 mg | ORAL_TABLET | Freq: Every day | ORAL | 1 refills | Status: DC

## 2021-04-28 MED ORDER — METFORMIN HCL 1000 MG PO TABS: 1000.0000 mg | ORAL_TABLET | Freq: Two times a day (BID) | ORAL | 1 refills | Status: DC

## 2021-04-28 MED ORDER — SIMVASTATIN 20 MG PO TABS: 20.0000 mg | ORAL_TABLET | Freq: Every evening | ORAL | 1 refills | Status: DC

## 2021-04-28 NOTE — Progress Notes (Signed)
   Subjective:    Patient ID: Jose Shah, male    DOB: [birth year], 75 y.o.   MRN: [id number]  HPI Pt here for follow up on DM. Pt states he monitors his sugars; meds have been cut back. Taking all meds as directed.   The patient was seen today as part of a comprehensive diabetic check up. Patient has diabetes  Compliance-relates good compliance Low sugars-tries to eat healthy Dietary effort-tries to minimize sugars denies any low sugar spells Foot exam and ophthalmology exam requirements were reviewed   Patient for blood pressure check up.  The patient does have hypertension.    Medication compliance-states good compliance  Blood pressure control recently-blood pressure today is doing well  Dietary compliance-minimizes salt  Patient here for follow-up regarding cholesterol.    Diet-avoids fried foods  Compliance with medicine-takes medicine daily  Side effects-denies side effects  Activity-stays physically active  Regular lab work regarding lipid and liver was checked and if needing additional labs was appropriately ordered  Review of Systems     Objective:   Physical Exam General-in no acute distress Eyes-no discharge Lungs-respiratory rate normal, CTA CV-no murmurs,RRR Extremities skin warm dry no edema Neuro grossly normal Behavior normal, alert        Assessment & Plan:   The patient was seen today as part of a comprehensive visit for diabetes. The importance of keeping her A1c at or below 7 range was discussed.  Discussed diet, activity, and medication compliance Standard follow-up visit recommended.  Patient aware lack of control and follow-up increases risk of diabetic complications.  HTN- patient seen for follow-up regarding HTN.  Diet, medication compliance, appropriate labs and refills were completed.  Importance of keeping blood pressure under good control to lessen the risk of complications discussed  Hyperlipidemia-importance of diet,  weight control, activity, compliance with medications discussed.  Recent labs reviewed.  Any additional labs or refills ordered.  Importance of keeping under good control discussed.  Labs overall look good.  Healthy diet recommended.  Follow-up by early spring

## 2021-04-28 NOTE — Patient Instructions (Signed)
Please do your labs about 1 week before your spring visit at [facility]

## 2021-09-03 ENCOUNTER — Telehealth: Payer: Self-pay | Admitting: Family Medicine

## 2021-09-03 NOTE — Telephone Encounter (Signed)
Left message for patient to call back and schedule Medicare Annual Wellness Visit (AWV) in office.  ? ?If unable to come into the office for AWV,  please offer to do virtually or by telephone. ? ?Last AWV: 10/17/2019 ? ?Please schedule at anytime with [facility]. ? ?30 minute appointment for Virtual or phone ? ?45 minute appointment for in office or Initial virtual/phone ? ?Any questions, please contact me at [phone]  ?  ?  ?

## 2021-09-23 ENCOUNTER — Telehealth: Payer: Self-pay | Admitting: Family Medicine

## 2021-09-23 DIAGNOSIS — E119 Type 2 diabetes mellitus without complications: Secondary | ICD-10-CM

## 2021-09-23 DIAGNOSIS — E1169 Type 2 diabetes mellitus with other specified complication: Secondary | ICD-10-CM

## 2021-09-23 DIAGNOSIS — I10 Essential (primary) hypertension: Secondary | ICD-10-CM

## 2021-09-23 DIAGNOSIS — Z79899 Other long term (current) drug therapy: Secondary | ICD-10-CM

## 2021-09-23 NOTE — Telephone Encounter (Signed)
Patient is requesting labs for upcoming office visit ?

## 2021-09-23 NOTE — Telephone Encounter (Signed)
Last labs ordered 01/2021 uirne / micro, psa, bmp, a1c -has an appt on 10/13/21 ?

## 2021-09-24 NOTE — Telephone Encounter (Signed)
Patient informed labs were ordered.

## 2021-09-24 NOTE — Telephone Encounter (Signed)
Labs ordered in epic.Marland KitchenMarland KitchenHonomu ?

## 2021-09-24 NOTE — Telephone Encounter (Signed)
Metabolic 7, lipid, liver, A1c ?

## 2021-10-07 LAB — HEPATIC FUNCTION PANEL
ALT: 13 IU/L (ref 0–44)
AST: 10 IU/L (ref 0–40)
Albumin: 4.7 g/dL (ref 3.7–4.7)
Alkaline Phosphatase: 56 IU/L (ref 44–121)
Bilirubin Total: 0.6 mg/dL (ref 0.0–1.2)
Bilirubin, Direct: 0.17 mg/dL (ref 0.00–0.40)
Total Protein: 6.6 g/dL (ref 6.0–8.5)

## 2021-10-07 LAB — BASIC METABOLIC PANEL
BUN/Creatinine Ratio: 12 (ref 10–24)
BUN: 13 mg/dL (ref 8–27)
CO2: 23 mmol/L (ref 20–29)
Calcium: 9.5 mg/dL (ref 8.6–10.2)
Chloride: 103 mmol/L (ref 96–106)
Creatinine, Ser: 1.06 mg/dL (ref 0.76–1.27)
Glucose: 158 mg/dL — ABNORMAL HIGH (ref 70–99)
Potassium: 4.5 mmol/L (ref 3.5–5.2)
Sodium: 142 mmol/L (ref 134–144)
eGFR: 73 mL/min/{1.73_m2} (ref >59)

## 2021-10-07 LAB — HEMOGLOBIN A1C
Est. average glucose Bld gHb Est-mCnc: 140 mg/dL
Hgb A1c MFr Bld: 6.5 % — ABNORMAL HIGH (ref 4.8–5.6)

## 2021-10-07 LAB — LIPID PANEL
Chol/HDL Ratio: 2.8 ratio (ref 0.0–5.0)
Cholesterol, Total: 142 mg/dL (ref 100–199)
HDL: 51 mg/dL (ref >39)
LDL Chol Calc (NIH): 72 mg/dL (ref 0–99)
Triglycerides: 102 mg/dL (ref 0–149)
VLDL Cholesterol Cal: 19 mg/dL (ref 5–40)

## 2021-10-13 ENCOUNTER — Ambulatory Visit (INDEPENDENT_AMBULATORY_CARE_PROVIDER_SITE_OTHER): Payer: Medicare Other | Admitting: Family Medicine

## 2021-10-13 VITALS — BP 120/76 | HR 75 | Temp 97.9°F | Wt 166.8 lb

## 2021-10-13 DIAGNOSIS — E1169 Type 2 diabetes mellitus with other specified complication: Secondary | ICD-10-CM

## 2021-10-13 DIAGNOSIS — L209 Atopic dermatitis, unspecified: Secondary | ICD-10-CM | POA: Diagnosis not present

## 2021-10-13 DIAGNOSIS — I10 Essential (primary) hypertension: Secondary | ICD-10-CM

## 2021-10-13 DIAGNOSIS — E119 Type 2 diabetes mellitus without complications: Secondary | ICD-10-CM

## 2021-10-13 DIAGNOSIS — E785 Hyperlipidemia, unspecified: Secondary | ICD-10-CM

## 2021-10-13 MED ORDER — ENALAPRIL MALEATE 10 MG PO TABS: 10.0000 mg | ORAL_TABLET | Freq: Every day | ORAL | 1 refills | Status: DC

## 2021-10-13 MED ORDER — METFORMIN HCL 1000 MG PO TABS: 1000.0000 mg | ORAL_TABLET | Freq: Two times a day (BID) | ORAL | 1 refills | Status: DC

## 2021-10-13 MED ORDER — SIMVASTATIN 20 MG PO TABS: 20.0000 mg | ORAL_TABLET | Freq: Every evening | ORAL | 1 refills | Status: DC

## 2021-10-13 MED ORDER — TRIAMCINOLONE ACETONIDE 0.1 % EX CREA: TOPICAL_CREAM | CUTANEOUS | 1 refills | Status: AC

## 2021-10-13 NOTE — Progress Notes (Signed)
? ?  Subjective:  ? ? Patient ID: Jose Shah, male    DOB: 02-Jan-1946, 76 y.o.   MRN: [id number] ? ?HPI ?Pt here for follow up. Pt taking all meds as directed. Pt would like printed scripts. Blood pressure has been good. Pt did have blood work completed last week  ?Reviewed over the labs ? ?Results for orders placed or performed in visit on 09/23/21  ?Basic Metabolic Panel (BMET)  ?Result Value Ref Range  ? Glucose 158 (H) 70 - 99 mg/dL  ? BUN 13 8 - 27 mg/dL  ? Creatinine, Ser 1.06 0.76 - 1.27 mg/dL  ? eGFR 73 >59 mL/min/1.73  ? BUN/Creatinine Ratio 12 10 - 24  ? Sodium 142 134 - 144 mmol/L  ? Potassium 4.5 3.5 - 5.2 mmol/L  ? Chloride 103 96 - 106 mmol/L  ? CO2 23 20 - 29 mmol/L  ? Calcium 9.5 8.6 - 10.2 mg/dL  ?Lipid Profile  ?Result Value Ref Range  ? Cholesterol, Total 142 100 - 199 mg/dL  ? Triglycerides 102 0 - 149 mg/dL  ? HDL 51 >39 mg/dL  ? VLDL Cholesterol Cal 19 5 - 40 mg/dL  ? LDL Chol Calc (NIH) 72 0 - 99 mg/dL  ? Chol/HDL Ratio 2.8 0.0 - 5.0 ratio  ?Hepatic function panel  ?Result Value Ref Range  ? Total Protein 6.6 6.0 - 8.5 g/dL  ? Albumin 4.7 3.7 - 4.7 g/dL  ? Bilirubin Total 0.6 0.0 - 1.2 mg/dL  ? Bilirubin, Direct 0.17 0.00 - 0.40 mg/dL  ? Alkaline Phosphatase 56 44 - 121 IU/L  ? AST 10 0 - 40 IU/L  ? ALT 13 0 - 44 IU/L  ?Hemoglobin A1C  ?Result Value Ref Range  ? Hgb A1c MFr Bld 6.5 (H) 4.8 - 5.6 %  ? Est. average glucose Bld gHb Est-mCnc 140 mg/dL  ? ? ?Review of Systems ? ?   ?Objective:  ? Physical Exam ?General-in no acute distress ?Eyes-no discharge ?Lungs-respiratory rate normal, CTA ?CV-no murmurs,RRR ?Extremities skin warm dry no edema ?Neuro grossly normal ?Behavior normal, alert ? ?Patient has had his eye exam but he does not remember where it was he states it was several months ago ? ? ?   ?Assessment & Plan:  ?1. Hyperlipidemia associated with type 2 diabetes mellitus ([place]) ?Cholesterol decent control continue current medication ? ?2. Essential hypertension, benign ?Blood pressure  good control watch diet stay active ? ?3. Diabetes mellitus without complication ([place]) ?Diabetes good control continue current medication ? ?4. Atopic dermatitis, unspecified type ?On the right upper leg near the knee recommend triamcinolone twice daily as needed ? ?Diabetic foot exam normal ? ? ?

## 2021-11-03 ENCOUNTER — Telehealth: Payer: Self-pay | Admitting: Family Medicine

## 2021-11-03 NOTE — Telephone Encounter (Signed)
Left message for patient to call back and schedule Medicare Annual Wellness Visit (AWV) in office.   If unable to come into the office for AWV,  please offer to do virtually or by telephone.  Last AWV: 10/17/2019  Please schedule at anytime with [facility].  30 minute appointment for Virtual or phone  45 minute appointment for in office or Initial virtual/phone  Any questions, please contact me at [phone]

## 2022-03-06 ENCOUNTER — Telehealth: Payer: Self-pay | Admitting: Family Medicine

## 2022-03-06 NOTE — Telephone Encounter (Signed)
Left message for patient to call back and schedule Medicare Annual Wellness Visit (AWV).  Please offer to do virtually or by telephone.  Last AWV: 10/17/2019  Please schedule at any time with [facility].  30 minute appointment for Virtual or phone  45 minute appointment for initial virtual/phone  Any questions, please contact me at [phone]

## 2022-04-01 ENCOUNTER — Telehealth: Payer: Self-pay | Admitting: Family Medicine

## 2022-04-01 DIAGNOSIS — E1169 Type 2 diabetes mellitus with other specified complication: Secondary | ICD-10-CM

## 2022-04-01 DIAGNOSIS — Z79899 Other long term (current) drug therapy: Secondary | ICD-10-CM

## 2022-04-01 DIAGNOSIS — I10 Essential (primary) hypertension: Secondary | ICD-10-CM

## 2022-04-01 DIAGNOSIS — E119 Type 2 diabetes mellitus without complications: Secondary | ICD-10-CM

## 2022-04-01 NOTE — Telephone Encounter (Signed)
Lab orders placed and pt is aware 

## 2022-04-01 NOTE — Telephone Encounter (Signed)
Patient  has appointment on 11/15 and needing labs done.

## 2022-04-01 NOTE — Telephone Encounter (Signed)
Last labs completed 10/06/21 A1C, Hepatic, Lipid and BMET. Please advise. Thank you

## 2022-04-01 NOTE — Telephone Encounter (Signed)
Lipid, liver, metabolic 7,  urine ACR Diabetes hyperlipidemia hypertension

## 2022-04-08 LAB — BASIC METABOLIC PANEL
BUN/Creatinine Ratio: 15 (ref 10–24)
BUN: 17 mg/dL (ref 8–27)
CO2: 23 mmol/L (ref 20–29)
Calcium: 10.3 mg/dL — ABNORMAL HIGH (ref 8.6–10.2)
Chloride: 103 mmol/L (ref 96–106)
Creatinine, Ser: 1.14 mg/dL (ref 0.76–1.27)
Glucose: 150 mg/dL — ABNORMAL HIGH (ref 70–99)
Potassium: 4.6 mmol/L (ref 3.5–5.2)
Sodium: 142 mmol/L (ref 134–144)
eGFR: 67 mL/min/{1.73_m2} (ref >59)

## 2022-04-08 LAB — LIPID PANEL
Chol/HDL Ratio: 2.7 ratio (ref 0.0–5.0)
Cholesterol, Total: 138 mg/dL (ref 100–199)
HDL: 51 mg/dL (ref >39)
LDL Chol Calc (NIH): 68 mg/dL (ref 0–99)
Triglycerides: 100 mg/dL (ref 0–149)
VLDL Cholesterol Cal: 19 mg/dL (ref 5–40)

## 2022-04-08 LAB — MICROALBUMIN / CREATININE URINE RATIO
Creatinine, Urine: 176.1 mg/dL
Microalb/Creat Ratio: 23 mg/g creat (ref 0–29)
Microalbumin, Urine: 41.1 ug/mL

## 2022-04-08 LAB — HEPATIC FUNCTION PANEL
ALT: 18 IU/L (ref 0–44)
AST: 18 IU/L (ref 0–40)
Albumin: 5.1 g/dL — ABNORMAL HIGH (ref 3.8–4.8)
Alkaline Phosphatase: 68 IU/L (ref 44–121)
Bilirubin Total: 0.7 mg/dL (ref 0.0–1.2)
Bilirubin, Direct: 0.22 mg/dL (ref 0.00–0.40)
Total Protein: 7.1 g/dL (ref 6.0–8.5)

## 2022-04-15 ENCOUNTER — Ambulatory Visit (INDEPENDENT_AMBULATORY_CARE_PROVIDER_SITE_OTHER): Payer: Medicare Other | Admitting: Family Medicine

## 2022-04-15 VITALS — BP 130/74 | HR 76 | Temp 97.3°F

## 2022-04-15 DIAGNOSIS — E1169 Type 2 diabetes mellitus with other specified complication: Secondary | ICD-10-CM

## 2022-04-15 DIAGNOSIS — Z23 Encounter for immunization: Secondary | ICD-10-CM | POA: Diagnosis not present

## 2022-04-15 DIAGNOSIS — I10 Essential (primary) hypertension: Secondary | ICD-10-CM

## 2022-04-15 DIAGNOSIS — E119 Type 2 diabetes mellitus without complications: Secondary | ICD-10-CM

## 2022-04-15 DIAGNOSIS — E785 Hyperlipidemia, unspecified: Secondary | ICD-10-CM

## 2022-04-15 DIAGNOSIS — E559 Vitamin D deficiency, unspecified: Secondary | ICD-10-CM

## 2022-04-15 MED ORDER — METFORMIN HCL 1000 MG PO TABS: 1000.0000 mg | ORAL_TABLET | Freq: Two times a day (BID) | ORAL | 1 refills | Status: DC

## 2022-04-15 MED ORDER — ENALAPRIL MALEATE 10 MG PO TABS
10.0000 mg | ORAL_TABLET | Freq: Every day | ORAL | 1 refills | Status: DC
Start: 2022-04-15 — End: 2022-10-14

## 2022-04-15 MED ORDER — SIMVASTATIN 20 MG PO TABS: 20.0000 mg | ORAL_TABLET | Freq: Every evening | ORAL | 1 refills | Status: DC

## 2022-04-15 NOTE — Patient Instructions (Signed)

## 2022-04-15 NOTE — Progress Notes (Signed)
   Subjective:    Patient ID: Jose Shah, male    DOB: Jun 10, 1945, 76 y.o.   MRN: [id number]  Hypertension This is a chronic problem. Treatments tried: enalapril.  Diabetes He presents for his follow-up diabetic visit. He has type 2 diabetes mellitus. Current diabetic treatments: metformin.   Immunization due - Plan: Flu Vaccine QUAD High Dose(Fluad)  Essential hypertension, benign  Diabetes mellitus without complication ([place]) - Plan: Hemoglobin A1c  Hyperlipidemia associated with type 2 diabetes mellitus (HCC)  Hypercalcemia - Plan: PTH, intact and calcium, CANCELED: PTH, Intact (ICMA) and Ionized Calcium  Vitamin D deficiency - Plan: Vitamin D, 25-hydroxy  Patient for blood pressure check up.  The patient does have hypertension.   Patient relates dietary measures try to minimize salt The importance of healthy diet and activity were discussed Patient relates compliance  The patient was seen today as part of a comprehensive diabetic check up. Patient has diabetes Patient relates good compliance with taking the medication. We discussed their diet and exercise activities  We also discussed the importance of notifying us if any excessively high glucoses or low sugars.   Patient here for follow-up regarding cholesterol.    Patient relates taking medication on a regular basis Denies problems with medication Importance of dietary measures discussed Regular lab work regarding lipid and liver was checked and if needing additional labs was appropriately ordered    Review of Systems     Objective:   Physical Exam General-in no acute distress Eyes-no discharge Lungs-respiratory rate normal, CTA CV-no murmurs,RRR Extremities skin warm dry no edema Neuro grossly normal Behavior normal, alert        Assessment & Plan:  1. Immunization due Flu shot today - Flu Vaccine QUAD High Dose(Fluad)  2. Essential hypertension, benign Blood pressure good control continue  current measures  3. Diabetes mellitus without complication (Jose Shah) Previous blood work looks good check A1c - Hemoglobin A1c  4. Hyperlipidemia associated with type 2 diabetes mellitus (HCC) Lipid profile looks very good continue medications  5. Hypercalcemia Elevated calcium patient denies excessive intake check PTH and ionized calcium - PTH, intact and calcium  6. Vitamin D deficiency Also go ahead and check vitamin D level could be associated with elevated calcium - Vitamin D, 25-hydroxy

## 2022-04-16 LAB — PTH, INTACT AND CALCIUM
Calcium: 9.7 mg/dL (ref 8.6–10.2)
PTH: 23 pg/mL (ref 15–65)

## 2022-04-16 LAB — VITAMIN D 25 HYDROXY (VIT D DEFICIENCY, FRACTURES): Vit D, 25-Hydroxy: 31.2 ng/mL (ref 30.0–100.0)

## 2022-04-16 LAB — HEMOGLOBIN A1C
Est. average glucose Bld gHb Est-mCnc: 146 mg/dL
Hgb A1c MFr Bld: 6.7 % — ABNORMAL HIGH (ref 4.8–5.6)

## 2022-04-18 ENCOUNTER — Encounter: Payer: Self-pay | Admitting: Family Medicine

## 2022-10-05 ENCOUNTER — Telehealth: Payer: Self-pay

## 2022-10-05 DIAGNOSIS — E1169 Type 2 diabetes mellitus with other specified complication: Secondary | ICD-10-CM

## 2022-10-05 DIAGNOSIS — E119 Type 2 diabetes mellitus without complications: Secondary | ICD-10-CM

## 2022-10-05 DIAGNOSIS — I10 Essential (primary) hypertension: Secondary | ICD-10-CM

## 2022-10-05 NOTE — Telephone Encounter (Signed)
Pt has appt next Wed at 10:20 and needs lab work order before then. Call when labs are ordered   Dajon Badolato -[phone]

## 2022-10-07 NOTE — Telephone Encounter (Signed)
Lipid, liver, metabolic 7, A1c, urine ACR  Hypertension, hyperlipidemia, diabetes, high risk med

## 2022-10-07 NOTE — Telephone Encounter (Signed)
Labs have been ordered in Epic

## 2022-10-09 LAB — BASIC METABOLIC PANEL (7)
BUN/Creatinine Ratio: 15 (ref 10–24)
BUN: 16 mg/dL (ref 8–27)
CO2: 21 mmol/L (ref 20–29)
Chloride: 102 mmol/L (ref 96–106)
Creatinine, Ser: 1.04 mg/dL (ref 0.76–1.27)
Glucose: 147 mg/dL — ABNORMAL HIGH (ref 70–99)
Potassium: 4.6 mmol/L (ref 3.5–5.2)
Sodium: 140 mmol/L (ref 134–144)
eGFR: 74 mL/min/{1.73_m2} (ref >59)

## 2022-10-09 LAB — HEPATIC FUNCTION PANEL
ALT: 22 IU/L (ref 0–44)
AST: 15 IU/L (ref 0–40)
Albumin: 4.6 g/dL (ref 3.8–4.8)
Alkaline Phosphatase: 59 IU/L (ref 44–121)
Bilirubin Total: 0.6 mg/dL (ref 0.0–1.2)
Bilirubin, Direct: 0.2 mg/dL (ref 0.00–0.40)
Total Protein: 6.4 g/dL (ref 6.0–8.5)

## 2022-10-09 LAB — LIPID PANEL
Chol/HDL Ratio: 2.6 ratio (ref 0.0–5.0)
Cholesterol, Total: 124 mg/dL (ref 100–199)
HDL: 48 mg/dL (ref >39)
LDL Chol Calc (NIH): 59 mg/dL (ref 0–99)
Triglycerides: 85 mg/dL (ref 0–149)
VLDL Cholesterol Cal: 17 mg/dL (ref 5–40)

## 2022-10-09 LAB — HEMOGLOBIN A1C
Est. average glucose Bld gHb Est-mCnc: 146 mg/dL
Hgb A1c MFr Bld: 6.7 % — ABNORMAL HIGH (ref 4.8–5.6)

## 2022-10-14 ENCOUNTER — Ambulatory Visit (INDEPENDENT_AMBULATORY_CARE_PROVIDER_SITE_OTHER): Payer: Medicare Other | Admitting: Family Medicine

## 2022-10-14 DIAGNOSIS — E1169 Type 2 diabetes mellitus with other specified complication: Secondary | ICD-10-CM | POA: Diagnosis not present

## 2022-10-14 DIAGNOSIS — Z7984 Long term (current) use of oral hypoglycemic drugs: Secondary | ICD-10-CM

## 2022-10-14 DIAGNOSIS — R634 Abnormal weight loss: Secondary | ICD-10-CM

## 2022-10-14 DIAGNOSIS — E785 Hyperlipidemia, unspecified: Secondary | ICD-10-CM

## 2022-10-14 DIAGNOSIS — E119 Type 2 diabetes mellitus without complications: Secondary | ICD-10-CM

## 2022-10-14 DIAGNOSIS — I10 Essential (primary) hypertension: Secondary | ICD-10-CM

## 2022-10-14 MED ORDER — METFORMIN HCL 1000 MG PO TABS: 1000.0000 mg | ORAL_TABLET | Freq: Two times a day (BID) | ORAL | 1 refills | Status: DC

## 2022-10-14 MED ORDER — SIMVASTATIN 20 MG PO TABS: 20.0000 mg | ORAL_TABLET | Freq: Every evening | ORAL | 1 refills | Status: DC

## 2022-10-14 MED ORDER — ENALAPRIL MALEATE 10 MG PO TABS: 10.0000 mg | ORAL_TABLET | Freq: Every day | ORAL | 1 refills | Status: DC

## 2022-10-14 NOTE — Progress Notes (Unsigned)
   Subjective:    Patient ID: Jose Shah, male    DOB: 02-07-1946, 77 y.o.   MRN: [id number]  HPI    Review of Systems     Objective:   Physical Exam  General-in no acute distress Eyes-no discharge Lungs-respiratory rate normal, CTA CV-no murmurs,RRR Extremities skin warm dry no edema Neuro grossly normal Behavior normal, alert       Assessment & Plan:  1. Essential hypertension, benign HTN- patient seen for follow-up regarding HTN.   Diet, medication compliance, appropriate labs and refills were completed.   Importance of keeping blood pressure under good control to lessen the risk of complications discussed Regular follow-up visits discussed   2. Hyperlipidemia associated with type 2 diabetes mellitus (HCC) Hyperlipidemia-importance of diet, weight control, activity, compliance with medications discussed.   Recent labs reviewed.   Any additional labs or refills ordered.   Importance of keeping under good control discussed. Regular follow-up visits discussed   3. Diabetes mellitus without complication (HCC) The patient was seen today as part of a comprehensive visit for diabetes. The importance of keeping her A1c at or below 7 range was discussed.  Discussed diet, activity, and medication compliance Emphasized healthy eating primarily with vegetables fruits and if utilizing meats lean meats such as chicken or fish grilled baked broiled Avoid sugary drinks Minimize and avoid processed foods Fit in regular physical activity preferably 25 to 30 minutes 4 times per week Standard follow-up visit recommended.  Patient aware lack of control and follow-up increases risk of diabetic complications. Regular follow-up visits Yearly ophthalmology Yearly foot exam Send [place] readings from the ophthalmology appointment  4. Weight loss He states this is going down because he eats less but if it continues to go down may well need further testing follow-up by fall time

## 2022-10-16 ENCOUNTER — Telehealth: Payer: Self-pay

## 2022-10-16 NOTE — Telephone Encounter (Signed)
Pt wife come by Friday needs a copy of husbands blood work   Pt call back [phone]

## 2022-10-19 NOTE — Telephone Encounter (Signed)
Done ready for pick-up 

## 2022-10-19 NOTE — Telephone Encounter (Signed)
Please print for patient thanks Pt wife come by Friday needs a copy of husbands blood work    Pt call back [phone]

## 2022-12-09 LAB — HM DIABETES EYE EXAM

## 2023-03-08 ENCOUNTER — Other Ambulatory Visit: Payer: Self-pay

## 2023-03-08 ENCOUNTER — Telehealth: Payer: Self-pay | Admitting: Family Medicine

## 2023-03-08 DIAGNOSIS — I10 Essential (primary) hypertension: Secondary | ICD-10-CM

## 2023-03-08 DIAGNOSIS — E119 Type 2 diabetes mellitus without complications: Secondary | ICD-10-CM

## 2023-03-08 NOTE — Telephone Encounter (Signed)
Patient wanting to know if he needing labs before appointment next week.

## 2023-03-08 NOTE — Telephone Encounter (Signed)
Informed the patient and wife per drs recommendations.

## 2023-03-08 NOTE — Telephone Encounter (Signed)
A1c, metabolic 7-urine ACR-diabetes, hypertension Please alert patient to be well-hydrated when he goes so that they can draw the blood check his kidney function plus also do a urine thank you

## 2023-03-12 LAB — BASIC METABOLIC PANEL
BUN/Creatinine Ratio: 12 (ref 10–24)
BUN: 14 mg/dL (ref 8–27)
CO2: 22 mmol/L (ref 20–29)
Calcium: 9.3 mg/dL (ref 8.6–10.2)
Chloride: 101 mmol/L (ref 96–106)
Creatinine, Ser: 1.13 mg/dL (ref 0.76–1.27)
Glucose: 141 mg/dL — ABNORMAL HIGH (ref 70–99)
Potassium: 4.5 mmol/L (ref 3.5–5.2)
Sodium: 139 mmol/L (ref 134–144)
eGFR: 67 mL/min/{1.73_m2} (ref >59)

## 2023-03-12 LAB — MICROALBUMIN / CREATININE URINE RATIO
Creatinine, Urine: 135.7 mg/dL
Microalb/Creat Ratio: 9 mg/g{creat} (ref 0–29)
Microalbumin, Urine: 12.7 ug/mL

## 2023-03-12 LAB — HEMOGLOBIN A1C
Est. average glucose Bld gHb Est-mCnc: 140 mg/dL
Hgb A1c MFr Bld: 6.5 % — ABNORMAL HIGH (ref 4.8–5.6)

## 2023-03-17 ENCOUNTER — Ambulatory Visit (INDEPENDENT_AMBULATORY_CARE_PROVIDER_SITE_OTHER): Payer: Medicare Other | Admitting: Family Medicine

## 2023-03-17 VITALS — BP 138/78 | HR 75 | Temp 97.3°F | Ht 70.0 in | Wt 166.0 lb

## 2023-03-17 DIAGNOSIS — D692 Other nonthrombocytopenic purpura: Secondary | ICD-10-CM

## 2023-03-17 DIAGNOSIS — E119 Type 2 diabetes mellitus without complications: Secondary | ICD-10-CM

## 2023-03-17 DIAGNOSIS — I10 Essential (primary) hypertension: Secondary | ICD-10-CM | POA: Diagnosis not present

## 2023-03-17 DIAGNOSIS — Z23 Encounter for immunization: Secondary | ICD-10-CM | POA: Diagnosis not present

## 2023-03-17 DIAGNOSIS — E1169 Type 2 diabetes mellitus with other specified complication: Secondary | ICD-10-CM | POA: Diagnosis not present

## 2023-03-17 DIAGNOSIS — E785 Hyperlipidemia, unspecified: Secondary | ICD-10-CM

## 2023-03-17 DIAGNOSIS — Z7984 Long term (current) use of oral hypoglycemic drugs: Secondary | ICD-10-CM | POA: Diagnosis not present

## 2023-03-17 MED ORDER — ENALAPRIL MALEATE 10 MG PO TABS: 10.0000 mg | ORAL_TABLET | Freq: Every day | ORAL | 2 refills | Status: DC

## 2023-03-17 MED ORDER — SIMVASTATIN 20 MG PO TABS: 20.0000 mg | ORAL_TABLET | Freq: Every evening | ORAL | 2 refills | Status: DC

## 2023-03-17 MED ORDER — METFORMIN HCL 1000 MG PO TABS: 1000.0000 mg | ORAL_TABLET | Freq: Two times a day (BID) | ORAL | 2 refills | Status: DC

## 2023-03-17 MED ORDER — BLOOD GLUCOSE METER KIT: PACK | 5 refills | Status: AC

## 2023-03-17 NOTE — Patient Instructions (Signed)
Results for orders placed or performed in visit on 03/08/23  Hemoglobin A1c  Result Value Ref Range   Hgb A1c MFr Bld 6.5 (H) 4.8 - 5.6 %   Est. average glucose Bld gHb Est-mCnc 140 mg/dL  Basic Metabolic Panel  Result Value Ref Range   Glucose 141 (H) 70 - 99 mg/dL   BUN 14 8 - 27 mg/dL   Creatinine, Ser 5.78 0.76 - 1.27 mg/dL   eGFR 67 >46 NG/EXB/2.84   BUN/Creatinine Ratio 12 10 - 24   Sodium 139 134 - 144 mmol/L   Potassium 4.5 3.5 - 5.2 mmol/L   Chloride 101 96 - 106 mmol/L   CO2 22 20 - 29 mmol/L   Calcium 9.3 8.6 - 10.2 mg/dL  Microalbumin/Creatinine Ratio, Urine  Result Value Ref Range   Creatinine, Urine 135.7 Not Estab. mg/dL   Microalbumin, Urine 13.2 Not Estab. ug/mL   Microalb/Creat Ratio 9 0 - 29 mg/g creat

## 2023-03-17 NOTE — Progress Notes (Signed)
**Note Jose-Identified via Obfuscation**    Subjective:    Patient ID: Jose Shah, male    DOB: 07-Jan-1946, 77 y.o.   MRN: [id number]  Discussed the use of AI scribe software for clinical note transcription with the patient, who gave verbal consent to proceed.  History of Present Illness   The patient, an 77 year old individual with a history of hypertension, hyperlipidemia, and diabetes, presents for a routine follow-up. He reports feeling generally well, with no new or worsening symptoms. He continues to engage in activities he enjoys, such as target shooting, and maintain an active lifestyle, including yard work. He acknowledges the physical limitations of aging and has adapted his activities accordingly to ensure safety.  The patient is compliant with his prescribed medications, including antihypertensives, cholesterol-lowering agents, and metformin for diabetes. He recently received his flu shot. He reports no issues with his vision, having recently seen his optometrist, Dr. Charise Killian, who found his eye health satisfactory. However, he is due for a follow-up with another eye specialist.  The patient's recent lab results show good control of his diabetes with an A1C of 6.5. His kidney function is within normal limits, and there is no significant proteinuria. He has not reported any concerns or worries about his health status.  The patient has osteoarthritis in his hands, which does not seem to cause significant discomfort. He also reports easy bruising, which is attributed to age-related skin changes. He has not experienced any falls or balance issues, but he acknowledges the need for caution when moving, especially when getting up from a seated position.         Review of Systems     Objective:    Physical Exam   VITALS: BP- 138/78, pulse normal. HEENT: Facial inspection normal. EXTREMITIES: Mild lower leg edema. SKIN: Senile purpura noted.     General-in no acute distress Eyes-no discharge Lungs-respiratory rate normal,  CTA CV-no murmurs,RRR Extremities skin warm dry no edema Neuro grossly normal Behavior normal, alert       Assessment & Plan:  Assessment and Plan    Hypertension Well controlled on current medication regimen. -Continue current antihypertensive medications.  Hyperlipidemia Well controlled on current medication regimen. -Continue current lipid-lowering medications.  Type 2 Diabetes Mellitus Well controlled with A1c of 6.5. -Continue Metformin. -Continue healthy eating habits.  General Health Maintenance -Flu vaccine administered. -Continue safe practices at home and during activities. -Continue regular eye check-ups with Dr. Montez Morita. -Order new glucose meter and strips.  Follow-up in 5-6 months (March/April 2025).     1. Immunization due Today - Flu Vaccine Trivalent High Dose (Fluad)  2. Essential hypertension, benign Blood pressure good readings - Basic Metabolic Panel  3. Diabetes mellitus without complication (HCC) Healthy diet regular activity check A1c before next visit readings reasonable control currently - Hemoglobin A1c  4. Hyperlipidemia associated with type 2 diabetes mellitus (HCC) Cholesterol good control continue current medications - Lipid Panel  Senile purpura noted on the arms minimal

## 2023-03-31 ENCOUNTER — Encounter: Payer: Self-pay | Admitting: Family Medicine

## 2023-09-08 ENCOUNTER — Telehealth: Payer: Self-pay

## 2023-09-08 NOTE — Telephone Encounter (Signed)
 Needs Blood work before next weeks appt

## 2023-09-08 NOTE — Telephone Encounter (Signed)
 Spoke with pt wife and made aware per provider lab orders

## 2023-09-08 NOTE — Telephone Encounter (Signed)
 Nurses-we do have labs ordered.  Patient can simply go to [facility] tomorrow to get this completed or Friday thank you

## 2023-09-11 LAB — BASIC METABOLIC PANEL WITH GFR
BUN/Creatinine Ratio: 14 (ref 10–24)
BUN: 16 mg/dL (ref 8–27)
CO2: 22 mmol/L (ref 20–29)
Calcium: 9.6 mg/dL (ref 8.6–10.2)
Chloride: 100 mmol/L (ref 96–106)
Creatinine, Ser: 1.16 mg/dL (ref 0.76–1.27)
Glucose: 156 mg/dL — ABNORMAL HIGH (ref 70–99)
Potassium: 4.6 mmol/L (ref 3.5–5.2)
Sodium: 139 mmol/L (ref 134–144)
eGFR: 64 mL/min/{1.73_m2} (ref >59)

## 2023-09-11 LAB — LIPID PANEL
Chol/HDL Ratio: 2.6 ratio (ref 0.0–5.0)
Cholesterol, Total: 110 mg/dL (ref 100–199)
HDL: 43 mg/dL (ref >39)
LDL Chol Calc (NIH): 50 mg/dL (ref 0–99)
Triglycerides: 87 mg/dL (ref 0–149)
VLDL Cholesterol Cal: 17 mg/dL (ref 5–40)

## 2023-09-11 LAB — HEMOGLOBIN A1C
Est. average glucose Bld gHb Est-mCnc: 143 mg/dL
Hgb A1c MFr Bld: 6.6 % — ABNORMAL HIGH (ref 4.8–5.6)

## 2023-09-15 ENCOUNTER — Ambulatory Visit (INDEPENDENT_AMBULATORY_CARE_PROVIDER_SITE_OTHER): Payer: Medicare Other | Admitting: Family Medicine

## 2023-09-15 VITALS — BP 118/76 | HR 115 | Temp 98.2°F | Ht 70.0 in | Wt 168.0 lb

## 2023-09-15 DIAGNOSIS — Z79899 Other long term (current) drug therapy: Secondary | ICD-10-CM

## 2023-09-15 DIAGNOSIS — E1159 Type 2 diabetes mellitus with other circulatory complications: Secondary | ICD-10-CM | POA: Diagnosis not present

## 2023-09-15 DIAGNOSIS — E1169 Type 2 diabetes mellitus with other specified complication: Secondary | ICD-10-CM

## 2023-09-15 DIAGNOSIS — I10 Essential (primary) hypertension: Secondary | ICD-10-CM

## 2023-09-15 DIAGNOSIS — E785 Hyperlipidemia, unspecified: Secondary | ICD-10-CM

## 2023-09-15 DIAGNOSIS — E119 Type 2 diabetes mellitus without complications: Secondary | ICD-10-CM

## 2023-09-15 MED ORDER — SIMVASTATIN 20 MG PO TABS: 20.0000 mg | ORAL_TABLET | Freq: Every evening | ORAL | 2 refills | Status: DC

## 2023-09-15 MED ORDER — ENALAPRIL MALEATE 10 MG PO TABS: 10.0000 mg | ORAL_TABLET | Freq: Every day | ORAL | 2 refills | Status: DC

## 2023-09-15 MED ORDER — METFORMIN HCL 1000 MG PO TABS: 1000.0000 mg | ORAL_TABLET | Freq: Two times a day (BID) | ORAL | 2 refills | Status: DC

## 2023-09-15 NOTE — Progress Notes (Signed)
   Subjective:    Patient ID: Jose Shah, male    DOB: 1945/10/29, 78 y.o.   MRN: [id number]  HPI Pt is here today for 6 month follow up with no questions or concerns at this time. Pt would like any prescriptions written and not sent electronically. Allergies and medications reviewed.   Discussed the use of AI scribe software for clinical note transcription with the patient, who gave verbal consent to proceed.  History of Present Illness   Jose Shah is a 78 year old male who presents for a routine follow-up visit.  He generally feels well and is cautious with his walking, having had no recent falls. He is more careful after observing friends who have had trouble walking and falling.  His eating habits are consistent, with regular meals for breakfast, lunch, and dinner. He takes his medication with food, usually in the morning with breakfast. He notes a decreased appetite, particularly at dinner.  His bowel movements are regular, and he experiences a decent urinary flow. His breathing remains stable during physical activity.  He consistently takes his medications, and his recent blood work from April 11 showed favorable results, including an LDL of 50, an A1c of 6.6, and good kidney function with a GFR and creatinine of 1.16.  His eyesight is stable, although he encountered issues with an eye checkup due to a discontinued service. He plans to obtain a copy of the results.  He uses [facility] for his prescriptions and is due for a refill.  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      Review of Systems     Objective:   Physical Exam.bno  General-in no acute distress Eyes-no discharge Lungs-respiratory rate normal, CTA CV-no murmurs,RRR Extremities skin warm dry no edema Neuro grossly normal Behavior normal, alert       Assessment & Plan:   Assessment and Plan    Type 2 Diabetes  Mellitus Well-controlled with A1c of 6.6%. - Continue current diabetes management plan including medication and healthy eating habits. - Monitor A1c levels regularly.  Hyperlipidemia Well-managed with LDL level of 50. - Continue current lipid management plan.  General Health Maintenance Maintains healthy lifestyle and medication compliance. Good kidney function and electrolyte balance. - Encourage regular eye checkups. - Print and sign prescriptions. - Schedule follow-up visit in six months with blood work prior to the visit.      1. Essential hypertension, benign (Primary) HTN- patient seen for follow-up regarding HTN.   Diet, medication compliance, appropriate labs and refills were completed.   Importance of keeping blood pressure under good control to lessen the risk of complications discussed Regular follow-up visits discussed  - Basic Metabolic Panel  2. Diabetes mellitus without complication (HCC) Lab work looks good continue current measures. - Lipid Panel Check A1c 3. Hyperlipidemia associated with type 2 diabetes mellitus (HCC) Healthy diet continue current measures - Lipid Panel  4. High risk medication use Lab work ordered - Microalbumin/Creatinine Ratio, Urine

## 2024-03-06 ENCOUNTER — Ambulatory Visit: Admitting: Family Medicine

## 2024-03-06 ENCOUNTER — Encounter: Payer: Self-pay | Admitting: Family Medicine

## 2024-03-06 VITALS — BP 115/73 | HR 103 | Ht 70.0 in | Wt 171.0 lb

## 2024-03-06 DIAGNOSIS — E1169 Type 2 diabetes mellitus with other specified complication: Secondary | ICD-10-CM | POA: Diagnosis not present

## 2024-03-06 DIAGNOSIS — Z23 Encounter for immunization: Secondary | ICD-10-CM

## 2024-03-06 DIAGNOSIS — I10 Essential (primary) hypertension: Secondary | ICD-10-CM

## 2024-03-06 DIAGNOSIS — Z7984 Long term (current) use of oral hypoglycemic drugs: Secondary | ICD-10-CM | POA: Diagnosis not present

## 2024-03-06 DIAGNOSIS — E119 Type 2 diabetes mellitus without complications: Secondary | ICD-10-CM

## 2024-03-06 DIAGNOSIS — Z79899 Other long term (current) drug therapy: Secondary | ICD-10-CM

## 2024-03-06 DIAGNOSIS — R251 Tremor, unspecified: Secondary | ICD-10-CM

## 2024-03-06 DIAGNOSIS — E785 Hyperlipidemia, unspecified: Secondary | ICD-10-CM

## 2024-03-06 MED ORDER — ENALAPRIL MALEATE 10 MG PO TABS: 10.0000 mg | ORAL_TABLET | Freq: Every day | ORAL | 2 refills | Status: AC

## 2024-03-06 MED ORDER — SIMVASTATIN 20 MG PO TABS: 20.0000 mg | ORAL_TABLET | Freq: Every evening | ORAL | 2 refills | Status: AC

## 2024-03-06 MED ORDER — METFORMIN HCL 1000 MG PO TABS: 1000.0000 mg | ORAL_TABLET | Freq: Two times a day (BID) | ORAL | 2 refills | Status: AC

## 2024-03-06 NOTE — Progress Notes (Signed)
   Subjective:    Patient ID: Jose Shah, male    DOB: 1945/10/13, 78 y.o.   MRN: [id number]  HPI He is here today with his wife Marshall 6 month follow up for HTN, DM, cholesterolemia Requests written RX for 3 meds Very nice gentleman Here today for follow-up Takes his medicine regular basis Patient for blood pressure check up.  The patient does have hypertension.   Patient relates dietary measures try to minimize salt The importance of healthy diet and activity were discussed Patient relates compliance  Patient here for follow-up regarding cholesterol.    Patient relates taking medication on a regular basis Denies problems with medication Importance of dietary measures discussed Regular lab work regarding lipid and liver was checked and if needing additional labs was appropriately ordered  The patient was seen today as part of a comprehensive diabetic check up. Patient has diabetes Patient relates good compliance with taking the medication. We discussed their diet and exercise activities  We also discussed the importance of notifying us  if any excessively high glucoses or low sugars.    He also has significant tremors of the left hand worse with active rest sometimes with activity he states he has been dealing with it for a good long time and denies any major setbacks with it Review of Systems     Objective:   Physical Exam Lungs clear hearts regular Resting tremor is noted in the left hand When he holds his hands help they do not have a tremor Patient has normal gait No cogwheeling       Assessment & Plan:  1. Immunization due (Primary) Today - Flu vaccine HIGH DOSE PF(Fluzone Trivalent)  2. Diabetes mellitus without complication (HCC) Continue metformin .  Check lab work.  Check A1c Healthy diet  - Hemoglobin A1c  3. Essential hypertension, benign Blood pressure good control continue current measures, continue medication, healthy diet - Urine  Microalbumin w/creat. ratio - Basic Metabolic Panel (BMET)  4. Hyperlipidemia associated with type 2 diabetes mellitus (HCC) Continue cholesterol medicine.  No setbacks recently. - Lipid Profile  5. High risk medication use Today - Hepatic function panel  6. Tremor Resting tremor Concerning for Parkinson's but do not find other findings of Parkinson's Patient defers on neurology consult Will monitor Follow-up 6 months follow-up sooner problems  He is here today with his spouse Jose Shah

## 2024-03-08 LAB — HEMOGLOBIN A1C
Est. average glucose Bld gHb Est-mCnc: 143 mg/dL
Hgb A1c MFr Bld: 6.6 % — ABNORMAL HIGH (ref 4.8–5.6)

## 2024-03-08 LAB — HEPATIC FUNCTION PANEL
ALT: 15 IU/L (ref 0–44)
AST: 15 IU/L (ref 0–40)
Albumin: 4.5 g/dL (ref 3.8–4.8)
Alkaline Phosphatase: 66 IU/L (ref 47–123)
Bilirubin Total: 0.8 mg/dL (ref 0.0–1.2)
Bilirubin, Direct: 0.29 mg/dL (ref 0.00–0.40)
Total Protein: 6.6 g/dL (ref 6.0–8.5)

## 2024-03-08 LAB — BASIC METABOLIC PANEL WITH GFR
BUN/Creatinine Ratio: 14 (ref 10–24)
BUN: 18 mg/dL (ref 8–27)
CO2: 23 mmol/L (ref 20–29)
Calcium: 9.5 mg/dL (ref 8.6–10.2)
Chloride: 101 mmol/L (ref 96–106)
Creatinine, Ser: 1.3 mg/dL — ABNORMAL HIGH (ref 0.76–1.27)
Glucose: 165 mg/dL — ABNORMAL HIGH (ref 70–99)
Potassium: 5 mmol/L (ref 3.5–5.2)
Sodium: 139 mmol/L (ref 134–144)
eGFR: 56 mL/min/1.73 — ABNORMAL LOW (ref >59)

## 2024-03-08 LAB — LIPID PANEL
Chol/HDL Ratio: 2.8 ratio (ref 0.0–5.0)
Cholesterol, Total: 127 mg/dL (ref 100–199)
HDL: 46 mg/dL (ref >39)
LDL Chol Calc (NIH): 64 mg/dL (ref 0–99)
Triglycerides: 89 mg/dL (ref 0–149)
VLDL Cholesterol Cal: 17 mg/dL (ref 5–40)

## 2024-03-08 LAB — MICROALBUMIN / CREATININE URINE RATIO
Creatinine, Urine: 206.6 mg/dL
Microalb/Creat Ratio: 19 mg/g{creat} (ref 0–29)
Microalbumin, Urine: 39.6 ug/mL

## 2024-03-09 ENCOUNTER — Ambulatory Visit: Payer: Self-pay | Admitting: Family Medicine

## 2024-04-04 ENCOUNTER — Other Ambulatory Visit: Payer: Self-pay

## 2024-04-04 DIAGNOSIS — R748 Abnormal levels of other serum enzymes: Secondary | ICD-10-CM

## 2024-04-06 ENCOUNTER — Ambulatory Visit: Payer: Self-pay | Admitting: Family Medicine

## 2024-04-06 LAB — LIPID PANEL
Chol/HDL Ratio: 2.8 ratio (ref 0.0–5.0)
Cholesterol, Total: 120 mg/dL (ref 100–199)
HDL: 43 mg/dL (ref >39)
LDL Chol Calc (NIH): 57 mg/dL (ref 0–99)
Triglycerides: 108 mg/dL (ref 0–149)
VLDL Cholesterol Cal: 20 mg/dL (ref 5–40)

## 2024-04-06 LAB — HEMOGLOBIN A1C
Est. average glucose Bld gHb Est-mCnc: 148 mg/dL
Hgb A1c MFr Bld: 6.8 % — ABNORMAL HIGH (ref 4.8–5.6)

## 2024-04-06 LAB — BASIC METABOLIC PANEL WITH GFR
BUN/Creatinine Ratio: 14 (ref 10–24)
BUN: 19 mg/dL (ref 8–27)
CO2: 21 mmol/L (ref 20–29)
Calcium: 9.4 mg/dL (ref 8.6–10.2)
Chloride: 100 mmol/L (ref 96–106)
Creatinine, Ser: 1.38 mg/dL — ABNORMAL HIGH (ref 0.76–1.27)
Glucose: 95 mg/dL (ref 70–99)
Potassium: 5.1 mmol/L (ref 3.5–5.2)
Sodium: 139 mmol/L (ref 134–144)
eGFR: 52 mL/min/1.73 — ABNORMAL LOW

## 2024-04-06 LAB — MICROALBUMIN / CREATININE URINE RATIO
Creatinine, Urine: 137.2 mg/dL
Microalb/Creat Ratio: 17 mg/g{creat} (ref 0–29)
Microalbumin, Urine: 22.9 ug/mL

## 2024-04-07 ENCOUNTER — Other Ambulatory Visit: Payer: Self-pay

## 2024-04-07 ENCOUNTER — Other Ambulatory Visit: Payer: Self-pay | Admitting: Family Medicine

## 2024-04-07 DIAGNOSIS — Z79899 Other long term (current) drug therapy: Secondary | ICD-10-CM

## 2024-04-07 DIAGNOSIS — E1169 Type 2 diabetes mellitus with other specified complication: Secondary | ICD-10-CM

## 2024-04-07 DIAGNOSIS — I10 Essential (primary) hypertension: Secondary | ICD-10-CM

## 2024-04-07 DIAGNOSIS — E119 Type 2 diabetes mellitus without complications: Secondary | ICD-10-CM

## 2024-09-04 ENCOUNTER — Ambulatory Visit: Admitting: Family Medicine
